# Patient Record
Sex: Male | Born: 1965 | Race: White | Hispanic: No | Marital: Married | Smoking: Current every day smoker
Health system: Southern US, Community
[De-identification: ages and names within clinical notes are randomized; demographics above are authoritative.]

## PROBLEM LIST (undated history)

## (undated) DIAGNOSIS — H409 Unspecified glaucoma: Secondary | ICD-10-CM

## (undated) DIAGNOSIS — E119 Type 2 diabetes mellitus without complications: Secondary | ICD-10-CM

## (undated) DIAGNOSIS — F329 Major depressive disorder, single episode, unspecified: Secondary | ICD-10-CM

## (undated) DIAGNOSIS — M1990 Unspecified osteoarthritis, unspecified site: Secondary | ICD-10-CM

## (undated) DIAGNOSIS — E785 Hyperlipidemia, unspecified: Secondary | ICD-10-CM

## (undated) DIAGNOSIS — F32A Depression, unspecified: Secondary | ICD-10-CM

## (undated) HISTORY — PX: SPINAL FUSION: SHX223

## (undated) HISTORY — DX: Hyperlipidemia, unspecified: E78.5

## (undated) HISTORY — DX: Type 2 diabetes mellitus without complications: E11.9

## (undated) HISTORY — DX: Unspecified glaucoma: H40.9

## (undated) HISTORY — DX: Depression, unspecified: F32.A

## (undated) HISTORY — DX: Unspecified osteoarthritis, unspecified site: M19.90

## (undated) HISTORY — PX: ANKLE SURGERY: SHX546

---
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
## 2001-03-28 ENCOUNTER — Emergency Department (HOSPITAL_COMMUNITY): Admission: EM | Admit: 2001-03-28 | Discharge: 2001-03-28 | Payer: Self-pay | Admitting: *Deleted

## 2005-03-25 ENCOUNTER — Encounter
Admission: RE | Admit: 2005-03-25 | Discharge: 2005-06-23 | Payer: Self-pay | Admitting: Physical Medicine & Rehabilitation

## 2005-03-25 ENCOUNTER — Ambulatory Visit: Payer: Self-pay | Admitting: Physical Medicine & Rehabilitation

## 2005-06-17 ENCOUNTER — Ambulatory Visit: Payer: Self-pay | Admitting: Physical Medicine & Rehabilitation

## 2005-07-08 ENCOUNTER — Ambulatory Visit (HOSPITAL_COMMUNITY)
Admission: RE | Admit: 2005-07-08 | Discharge: 2005-07-08 | Payer: Self-pay | Admitting: Physical Medicine & Rehabilitation

## 2005-07-26 ENCOUNTER — Encounter
Admission: RE | Admit: 2005-07-26 | Discharge: 2005-10-24 | Payer: Self-pay | Admitting: Physical Medicine & Rehabilitation

## 2005-07-26 ENCOUNTER — Ambulatory Visit: Payer: Self-pay | Admitting: Physical Medicine & Rehabilitation

## 2005-09-27 ENCOUNTER — Ambulatory Visit: Payer: Self-pay | Admitting: Physical Medicine & Rehabilitation

## 2006-01-17 ENCOUNTER — Encounter
Admission: RE | Admit: 2006-01-17 | Discharge: 2006-04-17 | Payer: Self-pay | Admitting: Physical Medicine & Rehabilitation

## 2006-02-03 ENCOUNTER — Ambulatory Visit: Payer: Self-pay | Admitting: Physical Medicine & Rehabilitation

## 2006-05-23 ENCOUNTER — Encounter
Admission: RE | Admit: 2006-05-23 | Discharge: 2006-08-21 | Payer: Self-pay | Admitting: Physical Medicine & Rehabilitation

## 2006-07-21 ENCOUNTER — Ambulatory Visit: Payer: Self-pay | Admitting: Physical Medicine & Rehabilitation

## 2006-12-18 ENCOUNTER — Encounter
Admission: RE | Admit: 2006-12-18 | Discharge: 2006-12-19 | Payer: Self-pay | Admitting: Physical Medicine & Rehabilitation

## 2006-12-18 ENCOUNTER — Ambulatory Visit: Payer: Self-pay | Admitting: Physical Medicine & Rehabilitation

## 2007-04-13 ENCOUNTER — Encounter
Admission: RE | Admit: 2007-04-13 | Discharge: 2007-04-15 | Payer: Self-pay | Admitting: Physical Medicine & Rehabilitation

## 2007-04-15 ENCOUNTER — Ambulatory Visit: Payer: Self-pay | Admitting: Physical Medicine & Rehabilitation

## 2007-07-06 ENCOUNTER — Encounter
Admission: RE | Admit: 2007-07-06 | Discharge: 2007-07-08 | Payer: Self-pay | Admitting: Physical Medicine & Rehabilitation

## 2007-07-08 ENCOUNTER — Ambulatory Visit: Payer: Self-pay | Admitting: Physical Medicine & Rehabilitation

## 2007-10-27 ENCOUNTER — Encounter
Admission: RE | Admit: 2007-10-27 | Discharge: 2007-10-28 | Payer: Self-pay | Admitting: Physical Medicine & Rehabilitation

## 2007-10-28 ENCOUNTER — Ambulatory Visit: Payer: Self-pay | Admitting: Physical Medicine & Rehabilitation

## 2008-01-22 ENCOUNTER — Encounter
Admission: RE | Admit: 2008-01-22 | Discharge: 2008-01-22 | Payer: Self-pay | Admitting: Physical Medicine & Rehabilitation

## 2008-01-22 ENCOUNTER — Ambulatory Visit: Payer: Self-pay | Admitting: Physical Medicine & Rehabilitation

## 2008-04-17 ENCOUNTER — Emergency Department (HOSPITAL_COMMUNITY): Admission: EM | Admit: 2008-04-17 | Discharge: 2008-04-17 | Payer: Self-pay | Admitting: Emergency Medicine

## 2008-05-19 ENCOUNTER — Encounter
Admission: RE | Admit: 2008-05-19 | Discharge: 2008-05-23 | Payer: Self-pay | Admitting: Physical Medicine & Rehabilitation

## 2008-05-23 ENCOUNTER — Ambulatory Visit: Payer: Self-pay | Admitting: Physical Medicine & Rehabilitation

## 2008-08-05 ENCOUNTER — Ambulatory Visit: Payer: Self-pay | Admitting: Physical Medicine & Rehabilitation

## 2008-08-05 ENCOUNTER — Encounter
Admission: RE | Admit: 2008-08-05 | Discharge: 2008-11-03 | Payer: Self-pay | Admitting: Physical Medicine & Rehabilitation

## 2008-11-14 ENCOUNTER — Encounter
Admission: RE | Admit: 2008-11-14 | Discharge: 2009-01-04 | Payer: Self-pay | Admitting: Physical Medicine & Rehabilitation

## 2008-11-15 ENCOUNTER — Ambulatory Visit: Payer: Self-pay | Admitting: Physical Medicine & Rehabilitation

## 2009-03-03 ENCOUNTER — Encounter
Admission: RE | Admit: 2009-03-03 | Discharge: 2009-03-03 | Payer: Self-pay | Admitting: Physical Medicine & Rehabilitation

## 2009-06-23 ENCOUNTER — Encounter: Admission: RE | Admit: 2009-06-23 | Discharge: 2009-06-23 | Payer: Self-pay | Admitting: *Deleted

## 2009-07-28 ENCOUNTER — Encounter: Admission: RE | Admit: 2009-07-28 | Discharge: 2009-07-28 | Payer: Self-pay | Admitting: Neurosurgery

## 2009-09-28 ENCOUNTER — Ambulatory Visit (HOSPITAL_COMMUNITY): Admission: RE | Admit: 2009-09-28 | Discharge: 2009-09-29 | Payer: Self-pay | Admitting: Neurosurgery

## 2010-03-22 LAB — CBC
HCT: 48.3 % (ref 39.0–52.0)
Hemoglobin: 16.8 g/dL (ref 13.0–17.0)
MCH: 32.2 pg (ref 26.0–34.0)
MCHC: 34.8 g/dL (ref 30.0–36.0)
MCV: 92.5 fL (ref 78.0–100.0)
Platelets: 208 10*3/uL (ref 150–400)
RBC: 5.22 MIL/uL (ref 4.22–5.81)
RDW: 13.3 % (ref 11.5–15.5)
WBC: 11.1 10*3/uL — ABNORMAL HIGH (ref 4.0–10.5)

## 2010-03-22 LAB — SURGICAL PCR SCREEN
MRSA, PCR: NEGATIVE
Staphylococcus aureus: NEGATIVE

## 2010-05-04 ENCOUNTER — Other Ambulatory Visit: Payer: Self-pay | Admitting: Neurosurgery

## 2010-05-04 DIAGNOSIS — M5410 Radiculopathy, site unspecified: Secondary | ICD-10-CM

## 2010-05-07 ENCOUNTER — Ambulatory Visit
Admission: RE | Admit: 2010-05-07 | Discharge: 2010-05-07 | Disposition: A | Discharge status: Home / Self Care | Payer: BC Managed Care – PPO | Source: Ambulatory Visit | Attending: Neurosurgery | Admitting: Neurosurgery

## 2010-05-07 DIAGNOSIS — M5410 Radiculopathy, site unspecified: Secondary | ICD-10-CM

## 2010-05-22 NOTE — Assessment & Plan Note (Signed)
Clinton West is back regarding his bilateral foot pain.  Pain ranges from 1-  2/10 to 8-9/10 depending on his activity levels.  He is walking more  with his job.  He notes that he has had some weight loss.  He seems to  think that it is because he is eating better.  He says he really does  not exercise much beyond his job required movement.  He is sleeping  fairly well.  He is using Vicodin 10/660 one q.6 h. p.r.n. for  breakthrough pain.   REVIEW OF SYSTEMS:  Notable for the above.  Full 14-point review is in  our written health and history section of the chart.   SOCIAL HISTORY:  The patient is married, living with his wife.  He is  smoking almost a pack of cigarettes per day, but trying to cut back.  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   PHYSICAL EXAMINATION:  VITAL SIGNS:  Blood pressure is 134/84, pulse is  90, respiratory rate 18, he is sating 97% on room air.  GENERAL:  The patient is pleasant, alert and oriented x3.  Weight is  stable.  HEART:  Regular.  CHEST:  Clear.  ABDOMEN:  Soft, nontender.  EXTREMITIES:  The patient walks with minimal antalgia.  NEUROLOGIC:  Intact.   ASSESSMENT:  1. Chronic metatarsalgia.  2. Myofascial cervical pain.   PLAN:  1. I discuss the fact that I would like to see his Vicodin use      decrease and I also feel that use of the medicine for breakthrough      pain has made him complaisant in his weight loss and therapy      efforts.  Therefore, we reduced his hydrocodone to 7.5/500 one q.6      h. p.r.n. #90.  2. The patient needs to work on regular exercise and weight control.  3. I will see him back in about 3 months' time.      Ranelle Oyster, M.D.  Electronically Signed     ZTS/MedQ  D:  08/05/2008 13:17:16  T:  08/06/2008 06:04:50  Job #:  [id number]

## 2010-05-22 NOTE — Assessment & Plan Note (Signed)
Clinton West is back regarding his metatarsalgia.  He did notice a big change  coming off the colchicine or allopurinol.  Pain is about 6/10.  He has  felt a little better with weight loss that he has lost almost 10 pounds  since I saw him last.  He is doing mostly light diet but with occasional  exercise as well.  He is hoping to settle down a bit with his job in the  coming year and is up for a promotion.  Pain is usually in his feet and  associated with prolonged standing and weightbearing.  He describes pain  as sharp, burning, and stabbing.   SOCIAL HISTORY:  As noted above.  He still working about 80 hours a week  doing a lot of traveling.  He is still smoking 1-1/2-pack of cigarettes  per day.   REVIEW OF SYSTEMS:  Notable for the above.  Full review is in the  written health and history section.   PHYSICAL EXAMINATION:  VITAL SIGNS:  Blood pressure is 141/71, pulse is  84, respiratory rate 18.  He is sating 97% on room air.  GENERAL:  The patient is pleasant, alert, and oriented x3.  Weight  perhaps is slightly down.  HEART:  Regular.  CHEST:  Clear.  ABDOMEN:  Soft, nontender.   ASSESSMENT:  1. Chronic metatarsalgia.  2. Myofascial cervical pain.   PLAN:  1. Refill Vicodin 10/650 one q.6 h. p.r.n. #90.  2. Continue with exercise, diet, lifestyle changes, etc.  3. UDS was collected today.  4. We will see him back in about 3 months' time.  I have nothing new      to offer.      Ranelle Oyster, M.D.  Electronically Signed     ZTS/MedQ  D:  01/22/2008 13:23:53  T:  01/23/2008 02:15:36  Job #:  [id number]

## 2010-05-22 NOTE — Assessment & Plan Note (Signed)
Clinton West is back regarding his feet.  He saw Dr. Candis Shine at [facility],  who really had no new recommendations other than some orthotics.  He has  gotten some better shoes and is doing a bit better there.  He has come  to some realization that he has to change his lifestyle in order to help  his pain.  He uses Vicodin for breakthrough pain still with some relief.  His sleep is fair.  He is looking at changing to a job which is more  sedentary and less stressful in its travel requirements.  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   REVIEW OF SYSTEMS:  Notable for the above.  Other pertinent positives  are in the health and history section of the chart.   SOCIAL HISTORY:  The patient is married.  He is still smoking 1 pack per  day and is trying to cut that down.   PHYSICAL EXAMINATION:  VITAL SIGNS:  Blood pressure is 128/68, pulse 82,  respiratory rate 18, satting 97% on room air.  GENERAL:  The patient is pleasant, alert and oriented x 3.  His affect  is a bit brighter and he seemed a bit more energized than I had seen him  today.  He continues to have flattened arches and shows some  hyperpronation of the feet as he bears weight.  The weight appears  generally the same.  HEART:  Regular.  CHEST:  Clear.   ASSESSMENT:  1. Chronic metatarsalgia and foot pain.  2. Myofascial neck pain.   PLAN:  1. Continue hydrocodone 10/650 for breakthrough pain.  2. Continue appropriate shoewear and orthotics.  3. Encouraged weight loss and more appropriate diet and lifestyle.  4. I will see him back in about 4 months' time.      Ranelle Oyster, M.D.  Electronically Signed     ZTS/MedQ  D:  12/19/2006 13:46:49  T:  12/20/2006 11:10:15  Job #:  [id number]

## 2010-05-22 NOTE — Assessment & Plan Note (Signed)
Mr. Ronan is back regarding his chronic foot pain.  Essentially, he  has had some increase in his pain, as he has been on his feet more.  He  changed jobs and is more local but the job requires more physical  activity than previous.  He rates his pain at 4-5/10.  His Oswestry  score is 14% today.  Pain is sharp, burning, stabbing, aching.  Pain  interferes with general activity, relations with others, enjoyment of  life on a mild level.  Sleep has been poor and mostly related to the  intensity and stress over his job.   Review of systems is unchanged.  He is still working on weight loss and  does not get enough exercise as a whole.   SOCIAL HISTORY:  Noted above.  The patient is married and still smoking  a pack and a half cigarettes per day.   PHYSICAL EXAMINATION:  Blood pressure is 136/83, pulse is 76,  respiratory rate 18.  He is sating 98% on room air.  The patient is  pleasant, alert, and oriented x3.  Weight is unchanged.  Heart is  regular.  Chest is clear.  Abdomen is soft, nontender.  Feet continued  to be tender along the metatarsals.   ASSESSMENT:  1. Chronic metatarsalgia.  2. Myofascial cervical pain.   PLAN:  1. Refill Vicodin 10/650 one q.6 h. p.r.n.  2. The patient has ongoing psychosocial issues which are prominent.      Until he is able to handle these a bit more productively and set      out time for himself to work on his health hygiene and exercise,      etc, I think he will continue the struggle with pain-related      issues.  3. Discussed use of naproxen over-the-counter 2-3 b.i.d. during      flares.  Otherwise, he should use 1 twice a day GI permitting.  4. I will see him back in about 3 months' time.      Ranelle Oyster, M.D.  Electronically Signed     ZTS/MedQ  D:  05/23/2008 09:53:15  T:  05/24/2008 00:21:26  Job #:  [id number]

## 2010-05-22 NOTE — Assessment & Plan Note (Signed)
Clinton West is back regarding his metatarsalgia.  We continued the colchicine  at last visit and added allopurinol to see if this will further improve  his symptoms.  He has not noticed much change.  The pain is still 6-  7/10.  He described it as sharp, burning, stabbing, and aching.  He  continues to go through a lot of stress in his life regarding his  business, psychosocial issues, etc.  He has had minimal exercise.  He is  talking about leaving his company and going on his own for business and  to improve his overall quality of life.  He describes pain is sharp,  burning, stabbing, and aching.   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  He is married.  His marriage is under a bit of stress.  He is  still smoking one and a half packs of cigarettes per day.  He  occasionally has alcohol.   REVIEW OF SYSTEMS:  Notable for the above.  Full review is in the  written health and history section of the chart.   PHYSICAL EXAMINATION:  VITAL SIGNS:  Blood pressure 135/82, pulse 94,  and respiratory rate 18, and he is sating 96% on room air.  GENERAL:  The patient is pleasant, alert, and oriented x3.  Weight is  unchanged.  He remains overweight.  He is bright and alert.  HEART:  Regular.  CHEST:  Clear.  ABDOMEN:  Soft and nontender.  EXTREMITIES:  No gross changes are noted in the feet today.  He has  flattened arches, and his feet are hyperpronated.   ASSESSMENT:  1. Chronic metatarsalgia with bilateral foot pain.  2. Myofascial neck pain.   PLAN:  1. We will try holding allopurinol and colchicine to see how his      symptoms are.  If his symptoms should increase, we need to check a      uric acid level and titrate allopurinol to his needed level.  2. Refilled the Vicodin 10/650 for breakthrough pain.  3. Continued encouraging better lifestyle, diet, exercise, etc.  4. I will see him back in 3 months.      Ranelle Oyster, M.D.  Electronically Signed     ZTS/MedQ  D:  10/28/2007 13:04:48  T:  10/29/2007 27:06:23  Job #:  [id number]

## 2010-05-22 NOTE — Assessment & Plan Note (Signed)
Clinton West is back regarding his metatarsalgia.  Not a lot of change since I  last saw him.  Feet bother him the more he is on them and busy.  He uses  hydrocodone for breakthrough symptoms.  He lost his shoes and custom  insoles, a few days ago.  He rates his pain an 8-9/10.  Sleep is fair.  Psychosocial issues still are outstanding, and he is working on some  other business ventures.   REVIEW OF SYSTEMS:  Notable for the above.  Full review is in written  health and history section in chart.  I did talk to him today and ask if  he had ever suffered from gout before, and he revealed to me that he  had.  He had not disclosed this to me previously.  He had taken  colchicine in the past for flare ups.   SOCIAL HISTORY:  The patient is married, and other pertinent positives  are above.  He is working on potentially two new business ventures.   PHYSICAL EXAMINATION:  VITAL SIGNS:  Blood pressure is 138/67.  Pulse is  94, respiratory rate 18.  He is satting 96% on room air.  GENERAL:  The patient is pleasant, alert and oriented x3.  Weight is  generally stable.  Affect is bright and appropriate.  Arches appear  flattened with hypo-pronation still noted.  HEART:  Regular.  CHEST:  Clear.  ABDOMEN:  Soft, nontender.   ASSESSMENT:  1. Chronic metatarsalgia with foot pain bilaterally.  2. Myofascial neck pain.   PLAN:  1. Will try colchicine scheduled 0.6 mg q. day. to treat this      potentially as chronic gout.  I wonder if that is what has been      going on all this time, although imaging was not indicative of      this.  2. Continue with appropriate shoe wear and orthotics.  I will have a      new set made per [facility].  3. Continue the improvement of diet and exercise, as his lifestyle and      job allow.  4. I will see him back in 3 months' time.      Ranelle Oyster, M.D.  Electronically Signed     ZTS/MedQ  D:  04/15/2007 12:46:03  T:  04/15/2007 13:04:20  Job  #:  [id number]

## 2010-05-22 NOTE — Assessment & Plan Note (Signed)
FOLLOWUP OFFICE NOTE   Clinton West is back regarding his pain.  He had some new pain in the neck over  the last day or so, developed significant pain into the suboccipital  region.  It may have been from how he slept on a plane with his head  flexed forward.  Foot pain is similar.  He has stopped Lyrica as he felt  it was no longer beneficial.  He is using the Pamelor and Vicodin.  The  Vicodin is for his primary source of pain.  He continues to work  extraordinary numbers of hours and essentially traveling throughout the  month.  He was home 4 days last month.  He rates his pain a 5/10 to  6/10.  His feet are worse with walking and standing.  He does try to  exercise, but has a hard time watching what he eats.  The patient flew  back here from [place] today to make this appointment.   REVIEW OF SYSTEMS:  Notable for the above.  He is physically and  mentally exhausted from his job.  The patient remains married.   PHYSICAL EXAM:  Blood pressure 157/78, pulse 92, respiratory rate is 18,  he is satting 98% on room air.  The patient appears very fatigued and worn out.  His weight appears  higher than when I saw him last.  Mood is fair.  Gait remains antalgic.  Slightly wide-based still.  He has flat arches and pain over the  metatarsal-tarsal joints.  Continues to hyperpronate feet when he walks.  Neck was very tender along the upper trapezius muscle and cervical  paraspinals, which reproduced pain with palpation today.   ASSESSMENT:  1. Chronic metatarsalgia and foot pain.  2. Myofascial neck pain.   PLAN:  1. After informed consent we injected 2 trigger points in both the      suboccipital areas using 2 cc of 1% lidocaine.  The patient      tolerated it well.  2. We gave him a trial of Limbrel 500 mg b.i.d. for breakthrough      symptoms.  3. Continue hydrocodone 10/60 one q.6h p.r.n. breakthrough pain #90.  4. Will make a referral to Dr. Candis Shine at [facility]  trauma/orthopedic surgeon who had dealt with his feet previously to      see if he has any further recommendations.  I think, most      obviously, Clinton West needs to work on alternative job arrangements,      which would include more time off at home, [employer].  What he is      doing now, he obviously will not be able to continue much longer      from both a physical and mental standpoint.  He needs to focus on      diet and exercise as well, which are not allowed with the hours he      is working.  5. I will see the patient back in about 4 months' time.      Ranelle Oyster, M.D.  Electronically Signed     ZTS/MedQ  D:  07/22/2006 12:24:57  T:  07/22/2006 20:52:21  Job #:  [id number]   cc:   Clinton West, M.D.  Fax: [phone]

## 2010-05-22 NOTE — Assessment & Plan Note (Signed)
HISTORY:  Clinton West is back regarding his metatarsalgia.  He feels that the  colchicine helped a bit for his foot pain.  He seems to be having a bit  more tolerance when he is up on his feet.  We never had a uric acid  checked.  He uses hydrocodone for more severe symptoms.  He has changes  to MET diet over the last several weeks, trying to eat better and drink  well water.  He feels that this is made a difference as well.  He  continues to have a lot of psychosocial/business stresses up on him.  He  rates his pain as 7-8/10.  He described as sharp, burning, aching.  Pain  interferes with general activity, relations with others, and enjoyment  of life on a mild level.   SOCIAL HISTORY:  Noted above.  He is still smoking 3 quarters of pack of  cigarettes per day.   PHYSICAL EXAMINATION:  VITAL SIGNS:  Blood pressure is 140/68, pulse is  94, respiratory rate 18, and he is sating 97% on room air.  GENERAL:  The patient is pleasant, alert, and oriented x3.  Weight is  unchanged.  Affect is bright and he is very alert.  HEART:  Regular.  CHEST:  Clear.  ABDOMEN:  Soft and nontender.  EXTREMITIES:  Feet are not grossly tender to touch.  They are flattened  and hypopronated.   ASSESSMENT:  1. Chronic metatarsalgia with foot pain bilaterally.  2. Myofascial neck pain.   PLAN:  1. We will add allopurinol 100 mg q.a.m.  2. Colchicine 0.6 mg daily.  3. Vicodin 10/660 for breakthrough pain control.  4. Encouraged to improve diet and exercise as he is attempting.  5. Continue with appropriate shoe wear and orthotics.  6. I will see him back in 3 months.      Ranelle Oyster, M.D.  Electronically Signed     ZTS/MedQ  D:  07/08/2007 13:29:15  T:  07/09/2007 06:48:51  Job #:  [id number]

## 2010-09-03 ENCOUNTER — Encounter (HOSPITAL_COMMUNITY)
Admission: RE | Admit: 2010-09-03 | Discharge: 2010-09-03 | Disposition: A | Discharge status: Home / Self Care | Payer: BC Managed Care – PPO | Source: Ambulatory Visit | Attending: Neurosurgery | Admitting: Neurosurgery

## 2010-09-03 LAB — CBC
HCT: 47.3 % (ref 39.0–52.0)
MCV: 91 fL (ref 78.0–100.0)
RBC: 5.2 MIL/uL (ref 4.22–5.81)
RDW: 13.2 % (ref 11.5–15.5)
WBC: 10.3 10*3/uL (ref 4.0–10.5)

## 2010-09-13 ENCOUNTER — Observation Stay (HOSPITAL_COMMUNITY): Payer: BC Managed Care – PPO

## 2010-09-13 ENCOUNTER — Observation Stay (HOSPITAL_COMMUNITY)
Admission: RE | Admit: 2010-09-13 | Discharge: 2010-09-14 | Disposition: A | Discharge status: Home / Self Care | Payer: BC Managed Care – PPO | Source: Ambulatory Visit | Attending: Neurosurgery | Admitting: Neurosurgery

## 2010-09-13 DIAGNOSIS — F17200 Nicotine dependence, unspecified, uncomplicated: Secondary | ICD-10-CM | POA: Insufficient documentation

## 2010-09-13 DIAGNOSIS — E669 Obesity, unspecified: Secondary | ICD-10-CM | POA: Insufficient documentation

## 2010-09-13 DIAGNOSIS — G4733 Obstructive sleep apnea (adult) (pediatric): Secondary | ICD-10-CM | POA: Insufficient documentation

## 2010-09-13 DIAGNOSIS — Z981 Arthrodesis status: Secondary | ICD-10-CM | POA: Insufficient documentation

## 2010-09-13 DIAGNOSIS — M47812 Spondylosis without myelopathy or radiculopathy, cervical region: Principal | ICD-10-CM | POA: Insufficient documentation

## 2010-09-13 DIAGNOSIS — Z01812 Encounter for preprocedural laboratory examination: Secondary | ICD-10-CM | POA: Insufficient documentation

## 2010-10-04 ENCOUNTER — Other Ambulatory Visit: Payer: Self-pay | Admitting: Neurosurgery

## 2010-10-04 ENCOUNTER — Ambulatory Visit
Admission: RE | Admit: 2010-10-04 | Discharge: 2010-10-04 | Disposition: A | Discharge status: Home / Self Care | Payer: BC Managed Care – PPO | Source: Ambulatory Visit | Attending: Neurosurgery | Admitting: Neurosurgery

## 2010-10-04 DIAGNOSIS — M542 Cervicalgia: Secondary | ICD-10-CM

## 2010-10-08 NOTE — Op Note (Signed)
NAME:  Clinton West, Clinton West NO.:  [id number]  MEDICAL RECORD NO.:  [id number]  LOCATION:  3599                         FACILITY:  [facility]  PHYSICIAN:  Reinaldo Meeker, M.D. DATE OF BIRTH:  1965/07/14  DATE OF PROCEDURE:  09/13/2010 DATE OF DISCHARGE:                              OPERATIVE REPORT   PREOPERATIVE DIAGNOSIS:  Spondylosis, C6-C7 status post anterior cervical diskectomy and fusion plate in Z6-X0, C6-C7.  POSTOPERATIVE DIAGNOSIS:  Spondylosis, C6-C7 status post anterior cervical diskectomy and fusion plate in R6-E4, C6-C7.  PROCEDURE:  Removal of anterior cervical plating C5-C7 with exploration of C5-C6 fusion followed by redo anterior cervical diskectomy at C6-C7 with trabecular metal interbody fusion followed by Tether anterior cervical plating at C6-C7.  SURGEON:  Reinaldo Meeker, MD  ASSISTANT:  Tia Alert, MD  PROCEDURE IN DETAIL:  After being placed in the supine position and 10 pounds halter traction, the patient's neck was prepped and draped in the usual sterile fashion.  Localizing fluoroscopy was used prior to incision to identify the appropriate level.  Transverse incision was made in the right anterior neck, started at the midline, headed towards the medial aspect of the sternocleidomastoid muscle.  The platysma muscle had been incised transversely.  The natural fascial plane between the strap muscles medially and sternocleidomastoid laterally was identified and followed down to the anterior aspect of the cervical spine.  Longus colli muscles were identified, split in the midline, and stripped away bilaterally with Optician, dispensing. Previous anterior cervical plate was then dissected free of soft tissue and removed.  C5-C6 fusion was identified and found to be solid.  We then dissected soft tissue away and placed a self-retaining retractor for exposure of C6-C7.  We used fluoroscopy to identify what the previous bone  graft was and used a high-speed drill to drill down through it.  We saved the bony shavings for use later in the case.  We continued to go progressively deeper and widened the disk space.  We then brought the microscope in to the later part of the case.  We got deep within the bone.  We did begin at the anterior soft tissue in the epidural space.  It was debrided through our bone graft.  We then dissected this free and gave it excellent decompression of the spinal dura including removal of the large bone spur on the left side and a foraminotomy and foraminal decompression at the C7 nerve on the left. We did decompression towards the right side as well.  We were not as aggressive due to the scarring and the fact that the patient was asymptomatic on that side.  At this time, inspection was carried out in all directions for evidence of residual compression, none could be identified.  Large amounts of irrigation was carried out.  Any bleeding controlled with bipolar coagulation and Gelfoam.  An 8-mm trabecular metal graft was chosen, filled the mixture of EquivaBone, autologous bone, and then impacted into the disk space with hemostasis confirmed once more.  We found it to be in excellent position.  An appropriate length Tether anterior cervical plate was then chosen.  Under fluoroscopic guidance, drill holes were placed  followed by placing of 3 standard 15-mm screws.  We did place one rescue screw in one of the previous holes but none of the other holes were used.  Final fluoroscopy showed good placement of plate, screws, and interbody spacer.  Large amounts of irrigation was carried out and any bleeding controlled with bipolar coagulation.  The wound was then closed with inverted Vicryl on the platysma muscle, inverted 5-0 PDS in subcuticular subcutaneous layer, and Steri-Strips on the skin.  Sterile dressing and soft collar applied.  The patient was extubated and taken to the recovery room  in stable condition.          ______________________________ Reinaldo Meeker, M.D.     ROK/MEDQ  D:  09/13/2010  T:  09/13/2010  Job:  [id number]  Electronically Signed by Aliene Beams M.D. on 10/08/2010 08:34:18 PM

## 2011-07-08 ENCOUNTER — Ambulatory Visit: Payer: BC Managed Care – PPO | Attending: Family Medicine

## 2011-07-08 DIAGNOSIS — IMO0001 Reserved for inherently not codable concepts without codable children: Secondary | ICD-10-CM | POA: Insufficient documentation

## 2011-07-08 DIAGNOSIS — M25619 Stiffness of unspecified shoulder, not elsewhere classified: Secondary | ICD-10-CM | POA: Insufficient documentation

## 2011-07-08 DIAGNOSIS — M542 Cervicalgia: Secondary | ICD-10-CM | POA: Insufficient documentation

## 2012-02-03 ENCOUNTER — Other Ambulatory Visit: Payer: Self-pay | Admitting: Neurosurgery

## 2012-02-03 DIAGNOSIS — M47817 Spondylosis without myelopathy or radiculopathy, lumbosacral region: Secondary | ICD-10-CM

## 2013-11-03 ENCOUNTER — Other Ambulatory Visit: Payer: Self-pay | Admitting: Family Medicine

## 2013-11-03 ENCOUNTER — Ambulatory Visit
Admission: RE | Admit: 2013-11-03 | Discharge: 2013-11-03 | Disposition: A | Discharge status: Home / Self Care | Payer: BC Managed Care – PPO | Source: Ambulatory Visit | Attending: Family Medicine | Admitting: Family Medicine

## 2013-11-03 DIAGNOSIS — R0602 Shortness of breath: Secondary | ICD-10-CM

## 2014-03-02 ENCOUNTER — Other Ambulatory Visit (HOSPITAL_COMMUNITY): Payer: Self-pay | Admitting: Family Medicine

## 2014-03-02 DIAGNOSIS — E236 Other disorders of pituitary gland: Secondary | ICD-10-CM

## 2014-03-14 ENCOUNTER — Encounter: Payer: Self-pay | Admitting: *Deleted

## 2014-03-14 ENCOUNTER — Encounter: Payer: BLUE CROSS/BLUE SHIELD | Attending: Family Medicine | Admitting: *Deleted

## 2014-03-14 DIAGNOSIS — E119 Type 2 diabetes mellitus without complications: Secondary | ICD-10-CM | POA: Diagnosis not present

## 2014-03-14 DIAGNOSIS — Z713 Dietary counseling and surveillance: Secondary | ICD-10-CM | POA: Diagnosis not present

## 2014-03-14 NOTE — Patient Instructions (Signed)
Goals:  Follow Diabetes Meal Plan as instructed  Eat 3 meals and 2 snacks, every 3-5 hrs  Limit carbohydrate intake to 45-60 grams carbohydrate/meal  Limit carbohydrate intake to 30 grams carbohydrate/snack  Add lean protein foods to meals/snacks  Monitor glucose levels as instructed by your doctor  Aim for 30 mins of physical activity daily  Bring food record and glucose log to your next nutrition visit

## 2014-03-14 NOTE — Progress Notes (Signed)
Diabetes Self-Management Education  Visit Type:    Appt. Start Time: 0945 Appt. End Time: 1115  03/14/2014  Mr. Clinton West, identified by name and date of birth, is a 49 y.o. male with a diagnosis of Diabetes: Type 2.  Other people present during visit:  Spouse/SO   ASSESSMENT  Clinton West was recently diagnosed with type 2 diabetes.  There is a strong family history of diabetes, but he feels his lifestyle choices contributed to his diabetes diagnosis.  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  He frequently skips meals and is not physicaly active.    Initial Visit Information:  Are you currently following a meal plan?: Yes (low-carb, no white foods)   Are you taking your medications as prescribed?: Yes (sometimes forget) Are you checking your feet?: Yes How many days per week are you checking your feet?: 7 How often do you need to have someone help you when you read instructions, pamphlets, or other written materials from your doctor or pharmacy?: 1 - Never    Psychosocial:     Patient Belief/Attitude about Diabetes: Other (comment) (frustrated/overwhelmed) Self-care barriers: None Self-management support: Family Other persons present: Spouse/SO Patient Concerns: Nutrition/Meal planning, Monitoring Special Needs: None Preferred Learning Style: No preference indicated Learning Readiness: Contemplating  Complications:   Last HgB A1C per patient/outside source: 6.8 mg/dL How often do you check your blood sugar?: 0 times/day (not testing) (has been told to test, but doesn't know how ) Number of hypoglycemic episodes per month: 0 Number of hyperglycemic episodes per week:  (patient not sure.  feels like he already needs to go to the bathroom a lot and his visiion is already blurry) Have you had a dilated eye exam in the past 12 months?: Yes Have you had a dental exam in the past 12 months?: Yes  Diet Intake:  Breakfast:  3-4 times low sugar protein shake; might not have anything (gets bored with food and doesn't want to eat) Lunch: salad, meat- tries to limit the starches.  sometimes skips or might have hot dog; maybe granola bar Dinner: might go out- meat, vegetable; could be [place] or chicken wings or steak Snack (evening): sugar free candy (2 pieces); sugar-free apple pie, NSA ice cream or pudding Beverage(s): coffee with cream and Truvia, 1/2 and 1/2 tea or diet peach tea, G2, raspberry lemonade, diet soda, beer  Exercise:  Exercise: ADL's (walks sometimes at work, but also sits a lot)  Individualized Plan for Diabetes Self-Management Training:   Learning Objective:  Patient will have a greater understanding of diabetes self-management.  Patient education plan per assessed needs and concerns is to attend individual sessions.     Education Topics Reviewed with Patient Today:  Definition of diabetes, type 1 and 2, and the diagnosis of diabetes, Factors that contribute to the development of diabetes Role of diet in the treatment of diabetes and the relationship between the three main macronutrients and blood glucose level, Food label reading, portion sizes and measuring food., Carbohydrate counting, Effects of alcohol on blood glucose and safety factors with consumption of alcohol., Information on hints to eating out and maintain blood glucose control. Role of exercise on diabetes management, blood pressure control and cardiac health. Reviewed patients medication for diabetes, action, purpose, timing of dose and side effects. Purpose and frequency of SMBG. Taught treatment of hypoglycemia - the 15 rule. Assessed and discussed foot care and prevention of  foot problems Role of stress on diabetes, Identified and addressed patients feelings and concerns about diabetes      PATIENTS GOALS/Plan (Developed by the patient):     Plan:   Patient Instructions  Goals:  Follow Diabetes Meal Plan as  instructed  Eat 3 meals and 2 snacks, every 3-5 hrs  Limit carbohydrate intake to 45-60 grams carbohydrate/meal  Limit carbohydrate intake to 30 grams carbohydrate/snack  Add lean protein foods to meals/snacks  Monitor glucose levels as instructed by your doctor  Aim for 30 mins of physical activity daily  Bring food record and glucose log to your next nutrition visit     Expected Outcomes:  Other (comment) (patient frustrated at this time.  expect minimal changes)  Education material provided: Living Well with Diabetes, Meal plan card and Snack sheet  If problems or questions, patient to contact team via:  Phone  Future DSME appointment: PRN

## 2014-03-14 NOTE — Addendum Note (Signed)
Addended byCarrolyn Leigh: REAVIS, Arthea Nobel W on: 03/14/2014 02:35 PM   Modules accepted: Medications

## 2014-03-15 ENCOUNTER — Ambulatory Visit (HOSPITAL_COMMUNITY): Admission: RE | Admit: 2014-03-15 | Payer: BLUE CROSS/BLUE SHIELD | Source: Ambulatory Visit

## 2014-08-10 ENCOUNTER — Other Ambulatory Visit: Payer: Self-pay | Admitting: Neurosurgery

## 2014-08-10 DIAGNOSIS — M542 Cervicalgia: Secondary | ICD-10-CM

## 2014-08-22 ENCOUNTER — Other Ambulatory Visit: Payer: BLUE CROSS/BLUE SHIELD

## 2014-09-14 ENCOUNTER — Ambulatory Visit
Admission: RE | Admit: 2014-09-14 | Discharge: 2014-09-14 | Disposition: A | Discharge status: Home / Self Care | Payer: BLUE CROSS/BLUE SHIELD | Source: Ambulatory Visit | Attending: Neurosurgery | Admitting: Neurosurgery

## 2014-09-14 DIAGNOSIS — M542 Cervicalgia: Secondary | ICD-10-CM

## 2014-09-14 MED ORDER — MEPERIDINE HCL 100 MG/ML IJ SOLN
100.0000 mg | Freq: Once | INTRAMUSCULAR | Status: AC
  Administered 2014-09-14: 100 mg via INTRAMUSCULAR

## 2014-09-14 MED ORDER — IOHEXOL 300 MG/ML  SOLN
10.0000 mL | Freq: Once | INTRAMUSCULAR | Status: DC | PRN
  Administered 2014-09-14: 10 mL via INTRATHECAL

## 2014-09-14 MED ORDER — ONDANSETRON HCL 4 MG/2ML IJ SOLN
4.0000 mg | Freq: Once | INTRAMUSCULAR | Status: AC
  Administered 2014-09-14: 4 mg via INTRAMUSCULAR

## 2014-09-14 MED ORDER — DIAZEPAM 5 MG PO TABS
10.0000 mg | ORAL_TABLET | Freq: Once | ORAL | Status: AC
  Administered 2014-09-14: 10 mg via ORAL

## 2014-09-14 NOTE — Discharge Instructions (Signed)
Myelogram Discharge Instructions  1. Go home and rest quietly for the next 24 hours.  It is important to lie flat for the next 24 hours.  Get up only to go to the restroom.  You may lie in the bed or on a couch on your back, your stomach, your left side or your right side.  You may have one pillow under your head.  You may have pillows between your knees while you are on your side or under your knees while you are on your back.  2. DO NOT drive today.  Recline the seat as far back as it will go, while still wearing your seat belt, on the way home.  3. You may get up to go to the bathroom as needed.  You may sit up for 10 minutes to eat.  You may resume your normal diet and medications unless otherwise indicated.  Drink plenty of extra fluids today and tomorrow.  4. The incidence of a spinal headache with nausea and/or vomiting is about 5% (one in 20 patients).  If you develop a headache, lie flat and drink plenty of fluids until the headache goes away.  Caffeinated beverages may be helpful.  If you develop severe nausea and vomiting or a headache that does not go away with flat bed rest, call [phone].  5. You may resume normal activities after your 24 hours of bed rest is over; however, do not exert yourself strongly or do any heavy lifting tomorrow.  6. Call your physician for a follow-up appointment.   You may resume Adderall on Thursday, September 15, 2014 after 9:30a.m.

## 2014-09-14 NOTE — Progress Notes (Signed)
Patient states he has been off Adderall for at least the past two days.  jkl

## 2014-09-20 ENCOUNTER — Encounter (HOSPITAL_COMMUNITY): Payer: Self-pay | Admitting: *Deleted

## 2014-09-20 ENCOUNTER — Emergency Department (HOSPITAL_COMMUNITY): Payer: BLUE CROSS/BLUE SHIELD

## 2014-09-20 ENCOUNTER — Emergency Department (HOSPITAL_COMMUNITY)
Admission: EM | Admit: 2014-09-20 | Discharge: 2014-09-21 | Disposition: A | Discharge status: Home / Self Care | Payer: BLUE CROSS/BLUE SHIELD | Attending: Emergency Medicine | Admitting: Emergency Medicine

## 2014-09-20 DIAGNOSIS — Z7982 Long term (current) use of aspirin: Secondary | ICD-10-CM | POA: Insufficient documentation

## 2014-09-20 DIAGNOSIS — E119 Type 2 diabetes mellitus without complications: Secondary | ICD-10-CM | POA: Diagnosis not present

## 2014-09-20 DIAGNOSIS — Z79899 Other long term (current) drug therapy: Secondary | ICD-10-CM | POA: Insufficient documentation

## 2014-09-20 DIAGNOSIS — K859 Acute pancreatitis, unspecified: Secondary | ICD-10-CM | POA: Diagnosis not present

## 2014-09-20 DIAGNOSIS — E669 Obesity, unspecified: Secondary | ICD-10-CM | POA: Diagnosis not present

## 2014-09-20 DIAGNOSIS — Z720 Tobacco use: Secondary | ICD-10-CM | POA: Diagnosis not present

## 2014-09-20 DIAGNOSIS — R1013 Epigastric pain: Secondary | ICD-10-CM | POA: Diagnosis present

## 2014-09-20 LAB — LIPASE, BLOOD: Lipase: 990 U/L — ABNORMAL HIGH (ref 22–51)

## 2014-09-20 LAB — CBC
HEMATOCRIT: 46.6 % (ref 39.0–52.0)
Hemoglobin: 15.7 g/dL (ref 13.0–17.0)
MCH: 31.5 pg (ref 26.0–34.0)
MCHC: 33.7 g/dL (ref 30.0–36.0)
MCV: 93.6 fL (ref 78.0–100.0)
PLATELETS: 197 10*3/uL (ref 150–400)
RBC: 4.98 MIL/uL (ref 4.22–5.81)
RDW: 13.3 % (ref 11.5–15.5)
WBC: 13.7 10*3/uL — ABNORMAL HIGH (ref 4.0–10.5)

## 2014-09-20 LAB — COMPREHENSIVE METABOLIC PANEL
ALBUMIN: 3.8 g/dL (ref 3.5–5.0)
ALT: 24 U/L (ref 17–63)
AST: 20 U/L (ref 15–41)
Alkaline Phosphatase: 58 U/L (ref 38–126)
Anion gap: 8 (ref 5–15)
BILIRUBIN TOTAL: 0.5 mg/dL (ref 0.3–1.2)
BUN: 14 mg/dL (ref 6–20)
CHLORIDE: 102 mmol/L (ref 101–111)
CO2: 26 mmol/L (ref 22–32)
CREATININE: 0.86 mg/dL (ref 0.61–1.24)
Calcium: 9.1 mg/dL (ref 8.9–10.3)
GFR calc Af Amer: >60 mL/min (ref >60)
GLUCOSE: 112 mg/dL — AB (ref 65–99)
POTASSIUM: 3.9 mmol/L (ref 3.5–5.1)
Sodium: 136 mmol/L (ref 135–145)
TOTAL PROTEIN: 7.6 g/dL (ref 6.5–8.1)

## 2014-09-20 LAB — URINE MICROSCOPIC-ADD ON

## 2014-09-20 LAB — URINALYSIS, ROUTINE W REFLEX MICROSCOPIC
Glucose, UA: NEGATIVE mg/dL
KETONES UR: 15 mg/dL — AB
Leukocytes, UA: NEGATIVE
Nitrite: NEGATIVE
PH: 5 (ref 5.0–8.0)
Protein, ur: NEGATIVE mg/dL
SPECIFIC GRAVITY, URINE: 1.027 (ref 1.005–1.030)
Urobilinogen, UA: 0.2 mg/dL (ref 0.0–1.0)

## 2014-09-20 LAB — TROPONIN I: Troponin I: <0.03 ng/mL (ref <0.031)

## 2014-09-20 MED ORDER — GI COCKTAIL ~~LOC~~
30.0000 mL | Freq: Once | ORAL | Status: AC
  Administered 2014-09-20: 30 mL via ORAL

## 2014-09-20 MED ORDER — IOHEXOL 300 MG/ML  SOLN
100.0000 mL | Freq: Once | INTRAMUSCULAR | Status: AC | PRN
  Administered 2014-09-20: 100 mL via INTRAVENOUS

## 2014-09-20 MED ORDER — ONDANSETRON HCL 4 MG/2ML IJ SOLN
4.0000 mg | Freq: Once | INTRAMUSCULAR | Status: AC
  Administered 2014-09-20: 4 mg via INTRAVENOUS
  Filled 2014-09-20: qty 2

## 2014-09-20 MED ORDER — SODIUM CHLORIDE 0.9 % IV SOLN: 1000.0000 mL | INTRAVENOUS | Status: DC

## 2014-09-20 MED ORDER — SODIUM CHLORIDE 0.9 % IV SOLN
1000.0000 mL | Freq: Once | INTRAVENOUS | Status: AC
  Administered 2014-09-20: 1000 mL via INTRAVENOUS

## 2014-09-20 MED ORDER — HYDROMORPHONE HCL 1 MG/ML IJ SOLN
1.0000 mg | INTRAMUSCULAR | Status: DC | PRN
  Administered 2014-09-20 (×2): 1 mg via INTRAVENOUS
  Filled 2014-09-20 (×2): qty 1

## 2014-09-20 MED ORDER — OXYCODONE-ACETAMINOPHEN 5-325 MG PO TABS: 1.0000 | ORAL_TABLET | Freq: Four times a day (QID) | ORAL | Status: DC | PRN

## 2014-09-20 MED ORDER — ONDANSETRON 8 MG PO TBDP
8.0000 mg | ORAL_TABLET | Freq: Three times a day (TID) | ORAL | Status: DC | PRN
Start: 2014-09-20 — End: 2019-06-01

## 2014-09-20 NOTE — Discharge Instructions (Signed)

## 2014-09-20 NOTE — ED Notes (Signed)
Patient presents with c/o left lower abd pain for about 1 week.  Noticed the pain traveling up to his epigastric area.  Was seen by MD for the abd pain and was told to take an ASA  and was told it "may have been a small clot that passed from his leg"  Has been taking Mylanta, Zantac  Denies N/V, no SOB

## 2014-09-20 NOTE — ED Provider Notes (Signed)
CSN: [id number]     Arrival date & time 09/20/14  1848 History   First MD Initiated Contact with Patient 09/20/14 2120     Chief Complaint  Patient presents with  . Abdominal Pain   Patient is a 49 y.o. male presenting with abdominal pain.  Abdominal Pain  patient states the symptoms started about a week ago. Initially the pain was in the left lower quadrant. Gradually over the last few days pain has moved up towards the epigastric area. Patient states the pain in the upper abdomen radiates into his chest. It's sharp but also feels like a pressure. Tried to take Mylanta and Zantac but has not had any relief. Eyes any trouble with nausea or vomiting. Palpation increases the pain but he has not noticed anything in particular with his diet. No nausea vomiting or diarrhea. No blood in his stool. No shortness of breath. Patient drinks alcohol on occasion. Maybe 1 beer a couple times a week.  Past Medical History  Diagnosis Date  . Diabetes mellitus without complication    Past Surgical History  Procedure Laterality Date  . Spinal fusion  2012, 2013   Family History  Problem Relation Age of Onset  . Diabetes Brother   . Diabetes Maternal Uncle   . Diabetes Maternal Grandmother   . Cancer Maternal Grandmother    Social History  Substance Use Topics  . Smoking status: Current Every Day Smoker  . Smokeless tobacco: Never Used  . Alcohol Use: Yes     Comment: socially    Review of Systems  Gastrointestinal: Positive for abdominal pain.  All other systems reviewed and are negative.     Allergies  Other  Home Medications   Prior to Admission medications   Medication Sig Start Date End Date Taking? Authorizing Provider  amphetamine-dextroamphetamine (ADDERALL XR) 20 MG 24 hr capsule Take 20 mg by mouth 2 (two) times daily as needed (for ADD).  08/26/14  Yes Historical Provider, MD  aspirin 325 MG EC tablet Take 325 mg by mouth daily.   Yes Historical Provider, MD  Cholecalciferol  (VITAMIN D3) 10000 UNITS capsule Take 10,000 Units by mouth daily.   Yes Historical Provider, MD  Coenzyme Q10-Red Yeast Rice (CO Q-10 PLUS RED YEAST RICE PO) Take by mouth.   Yes Historical Provider, MD  diclofenac sodium (VOLTAREN) 1 % GEL Apply 2 g topically daily as needed (for pain).   Yes Historical Provider, MD  metFORMIN (GLUCOPHAGE-XR) 500 MG 24 hr tablet Take 500 mg by mouth daily with supper. 08/16/14  Yes Historical Provider, MD  Multiple Vitamin (MULTIVITAMIN) tablet Take 1 tablet by mouth daily.   Yes Historical Provider, MD  naproxen sodium (ANAPROX) 220 MG tablet Take 440 mg by mouth daily as needed (for pain).   Yes Historical Provider, MD  Pramoxine-Menthol (GOLD BOND MEDICATED ANTI ITCH) 1-1 % CREA Apply 1 application topically daily.   Yes Historical Provider, MD  Prasterone, DHEA, 25 MG TABS Take 25 mg by mouth daily.    Yes Historical Provider, MD  ondansetron (ZOFRAN ODT) 8 MG disintegrating tablet Take 1 tablet (8 mg total) by mouth every 8 (eight) hours as needed for nausea or vomiting. 09/20/14   Linwood Dibbles, MD  oxyCODONE-acetaminophen (PERCOCET/ROXICET) 5-325 MG per tablet Take 1-2 tablets by mouth every 6 (six) hours as needed. 09/20/14   Linwood Dibbles, MD   BP 114/79 mmHg  Pulse 87  Temp(Src) 98 F (36.7 C) (Oral)  Resp 16  Ht 6\' 2"  (  1.88 m)  Wt 309 lb 11.2 oz (140.479 kg)  BMI 39.75 kg/m2  SpO2 95% Physical Exam  Constitutional: He appears well-developed and well-nourished. No distress.  Obese  HENT:  Head: Normocephalic and atraumatic.  Right Ear: External ear normal.  Left Ear: External ear normal.  Eyes: Conjunctivae are normal. Right eye exhibits no discharge. Left eye exhibits no discharge. No scleral icterus.  Neck: Neck supple. No tracheal deviation present.  Cardiovascular: Normal rate, regular rhythm and intact distal pulses.   Pulmonary/Chest: Effort normal and breath sounds normal. No stridor. No respiratory distress. He has no wheezes. He has no rales.   Abdominal: Soft. Bowel sounds are normal. He exhibits no distension. There is tenderness in the epigastric area. There is no rigidity, no rebound and no guarding.  Musculoskeletal: He exhibits no edema or tenderness.  Neurological: He is alert. He has normal strength. No cranial nerve deficit (no facial droop, extraocular movements intact, no slurred speech) or sensory deficit. He exhibits normal muscle tone. He displays no seizure activity. Coordination normal.  Skin: Skin is warm and dry. No rash noted.  Psychiatric: He has a normal mood and affect.  Nursing note and vitals reviewed.   ED Course  Procedures (including critical care time) Labs Review Labs Reviewed  LIPASE, BLOOD - Abnormal; Notable for the following:    Lipase 990 (*)    All other components within normal limits  COMPREHENSIVE METABOLIC PANEL - Abnormal; Notable for the following:    Glucose, Bld 112 (*)    All other components within normal limits  CBC - Abnormal; Notable for the following:    WBC 13.7 (*)    All other components within normal limits  URINALYSIS, ROUTINE W REFLEX MICROSCOPIC (NOT AT [facility]) - Abnormal; Notable for the following:    Hgb urine dipstick MODERATE (*)    Bilirubin Urine SMALL (*)    Ketones, ur 15 (*)    All other components within normal limits  TROPONIN I  URINE MICROSCOPIC-ADD ON    Imaging Review Dg Chest 2 View  09/20/2014   CLINICAL DATA:  Centralized chest pain for 3 days.  EXAM: CHEST  2 VIEW  COMPARISON:  Chest radiograph 11/03/2013  FINDINGS: Anterior cervical spinal fusion hardware. Stable cardiac and mediastinal contours. No consolidative pulmonary opacities. No pleural effusion or pneumothorax. Mid thoracic spine degenerative changes.  IMPRESSION: No acute cardiopulmonary process.   Electronically Signed   By: Annia Belt M.D.   On: 09/20/2014 20:35   Ct Abdomen Pelvis W Contrast  09/20/2014   CLINICAL DATA:  Left lower quadrant abdominal pain for 6 days. Mid upper  abdominal pain for 3 days. Pancreatitis.  EXAM: CT ABDOMEN AND PELVIS WITH CONTRAST  TECHNIQUE: Multidetector CT imaging of the abdomen and pelvis was performed using the standard protocol following bolus administration of intravenous contrast.  CONTRAST:  OMNIPAQUE IOHEXOL 300 MG/ML  SOLN  COMPARISON:  None.  FINDINGS: Lower chest: The included lung bases are clear. No pleural effusion.  Liver: Diffusely decreased density consistent with steatosis. Mild sparing adjacent to the gallbladder fossa. No focal lesion.  Hepatobiliary: Gallbladder physiologically distended without calcified stone or pericholecystic inflammatory change. Common bile duct is normal.  Pancreas: No definite peripancreatic inflammatory change. No soft tissue stranding. No ductal dilatation. There is a lobulated soft tissue density in the retroperitoneum, appears separate from the pancreas in the region of the uncinate process that is adjacent to the IVC, just posterior to the portal vein, and abutting  the duodenum measuring approximately 3.4 x 1.9 x 5.1 cm. Small nodes in the porta hepatis.  Spleen: Normal.  Adrenal glands: No nodule.  Kidneys: Symmetric renal enhancement. No hydronephrosis. No focal renal abnormality.  Stomach/Bowel: Stomach physiologically distended. There are no dilated or thickened small bowel loops. The appendix is normal. Moderate volume of stool throughout the colon without colonic wall thickening. There is faint pericolonic fat stranding adjacent to the anti mesenteric border the junction of the descending and sigmoid colon. No underlying diverticular disease, disproportionate wall thickening or definite epiploic appendage.  Vascular/Lymphatic: Small upper retroperitoneal lymph nodes. Abdominal aorta is normal in caliber.  Reproductive: Prostate gland is normal in size.  Bladder: Physiologically distended.  Other: No free air, free fluid, or intra-abdominal fluid collection. Minimal fat in both inguinal canals.  Small fat containing umbilical hernia.  Musculoskeletal: There are no acute or suspicious osseous abnormalities. Degenerative change in the lower lumbar spine.  IMPRESSION: 1. No CT findings of acute pancreatitis. 2. Lobulated soft tissue density in the retroperitoneum in the region of the uncinate process that appears distinctly separate from the pancreas. Recommend further characterization with MRI. 3. Faint fat stranding in the left lower quadrant adjacent to the junction of the descending/sigmoid colon. There is no definite underlying bowel abnormality. This may reflect small focus of fat necrosis. There is no underlying diverticular disease. 4. Hepatic steatosis.   Electronically Signed   By: Rubye Oaks M.D.   On: 09/20/2014 23:34   I have personally reviewed and evaluated these images and lab results as part of my medical decision-making.   EKG Interpretation   Date/Time:  Tuesday September 20 2014 19:05:42 EDT Ventricular Rate:  98 PR Interval:  166 QRS Duration: 110 QT Interval:  358 QTC Calculation: 457 R Axis:   47 Text Interpretation:  Normal sinus rhythm Normal ECG No significant change  since last tracing Confirmed by Cormac Wint  MD-J, Sherrilyn Nairn ([id number]) on 09/20/2014  9:20:33 PM     Medications  0.9 %  sodium chloride infusion (1,000 mLs Intravenous New Bag/Given 09/20/14 2204)    Followed by  0.9 %  sodium chloride infusion (not administered)  HYDROmorphone (DILAUDID) injection 1 mg (1 mg Intravenous Given 09/20/14 2347)  gi cocktail (Maalox,Lidocaine,Donnatal) (30 mLs Oral Given 09/20/14 1942)  ondansetron (ZOFRAN) injection 4 mg (4 mg Intravenous Given 09/20/14 2204)  iohexol (OMNIPAQUE) 300 MG/ML solution 100 mL (100 mLs Intravenous Contrast Given 09/20/14 2257)    MDM   Final diagnoses:  Acute pancreatitis, unspecified pancreatitis type    Patient's laboratory tests are consistent with acute pancreatitis. His EKG and cardiac enzymes do not suggest a cardiac etiology. The  cause of the pancreatitis is unclear.  Plan on CT scan of the abdomen and pelvis for further evaluation.  The patient's CT scan shows an area of fat stranding in left lower quadrant. This may be the source of the patient's left lower abdominal pain. He does not have any evidence of pancreatitis or other significant abnormality.  Does have a soft tissue density in the retroperitoneum. I discussed the importance of outpatient follow-up with his primary care doctor to arrange for an MRI.  The patient still would like to go home. We will plan on outpatient treatment with oxycodone and Zofran. He will follow liquid diet. There have been some case reports of metformin causing pancreatitis. I'll have the patient stop that medication temporarily.   Linwood Dibbles, MD 09/20/14 2352

## 2014-09-21 NOTE — ED Notes (Signed)
Pt left with all his belongings and ambulated out of treatment area.  

## 2014-09-27 ENCOUNTER — Other Ambulatory Visit: Payer: Self-pay | Admitting: Gastroenterology

## 2014-09-27 DIAGNOSIS — R935 Abnormal findings on diagnostic imaging of other abdominal regions, including retroperitoneum: Secondary | ICD-10-CM

## 2014-10-09 ENCOUNTER — Ambulatory Visit
Admission: RE | Admit: 2014-10-09 | Discharge: 2014-10-09 | Disposition: A | Discharge status: Home / Self Care | Payer: BLUE CROSS/BLUE SHIELD | Source: Ambulatory Visit | Attending: Gastroenterology | Admitting: Gastroenterology

## 2014-10-09 DIAGNOSIS — R935 Abnormal findings on diagnostic imaging of other abdominal regions, including retroperitoneum: Secondary | ICD-10-CM

## 2014-10-09 MED ORDER — GADOBENATE DIMEGLUMINE 529 MG/ML IV SOLN
20.0000 mL | Freq: Once | INTRAVENOUS | Status: AC | PRN
  Administered 2014-10-09: 20 mL via INTRAVENOUS

## 2015-11-06 ENCOUNTER — Encounter (HOSPITAL_COMMUNITY): Payer: Self-pay

## 2015-11-06 ENCOUNTER — Emergency Department (HOSPITAL_COMMUNITY)
Admission: EM | Admit: 2015-11-06 | Discharge: 2015-11-06 | Disposition: A | Discharge status: Home / Self Care | Payer: No Typology Code available for payment source | Attending: Emergency Medicine | Admitting: Emergency Medicine

## 2015-11-06 DIAGNOSIS — S0086XA Insect bite (nonvenomous) of other part of head, initial encounter: Secondary | ICD-10-CM | POA: Diagnosis present

## 2015-11-06 DIAGNOSIS — Y939 Activity, unspecified: Secondary | ICD-10-CM | POA: Diagnosis not present

## 2015-11-06 DIAGNOSIS — Y929 Unspecified place or not applicable: Secondary | ICD-10-CM | POA: Diagnosis not present

## 2015-11-06 DIAGNOSIS — Z7982 Long term (current) use of aspirin: Secondary | ICD-10-CM | POA: Insufficient documentation

## 2015-11-06 DIAGNOSIS — W57XXXA Bitten or stung by nonvenomous insect and other nonvenomous arthropods, initial encounter: Secondary | ICD-10-CM | POA: Diagnosis not present

## 2015-11-06 DIAGNOSIS — Y999 Unspecified external cause status: Secondary | ICD-10-CM | POA: Diagnosis not present

## 2015-11-06 DIAGNOSIS — F17210 Nicotine dependence, cigarettes, uncomplicated: Secondary | ICD-10-CM | POA: Diagnosis not present

## 2015-11-06 DIAGNOSIS — Z7984 Long term (current) use of oral hypoglycemic drugs: Secondary | ICD-10-CM | POA: Insufficient documentation

## 2015-11-06 DIAGNOSIS — E119 Type 2 diabetes mellitus without complications: Secondary | ICD-10-CM | POA: Diagnosis not present

## 2015-11-06 MED ORDER — DOXYCYCLINE HYCLATE 100 MG PO TABS
100.0000 mg | ORAL_TABLET | Freq: Once | ORAL | Status: AC
  Administered 2015-11-06: 100 mg via ORAL
  Filled 2015-11-06: qty 1

## 2015-11-06 MED ORDER — MUPIROCIN CALCIUM 2 % NA OINT: TOPICAL_OINTMENT | NASAL | 0 refills | Status: DC

## 2015-11-06 MED ORDER — DOXYCYCLINE HYCLATE 100 MG PO CAPS: 100.0000 mg | ORAL_CAPSULE | Freq: Two times a day (BID) | ORAL | 0 refills | Status: DC

## 2015-11-06 NOTE — ED Notes (Signed)
Declined W/C at D/C and was escorted to lobby by RN. 

## 2015-11-06 NOTE — ED Triage Notes (Signed)
Per Pt, Pt reports noticing bump on the left side of the head on Saturday that "looked like a zit." On Saturday, he noticed some discoloration that worsened since then. Pt's mother is a NP and pt has been taken Keflex and placed medication on the bite. Pt report jaw pain and ear pain. Bite has extended from dime size to quarter size overnight. Denies fever.

## 2015-11-06 NOTE — ED Provider Notes (Signed)
[facility]-EMERGENCY DEPT Provider Note   CSN: [id number] Arrival date & time: 11/06/15  1429  By signing my name below, I, Clinton West, attest that this documentation has been prepared under the direction and in the presence of News CorporationSamantha Anuj Summons PA-C. Electronically Signed: Sonum West, Neurosurgeoncribe. 11/06/15. 3:29 PM.  History   Chief Complaint Chief Complaint  Patient presents with  . Insect Bite    The history is provided by the patient. No language interpreter was used.    HPI Comments: Clinton West is a 50 y.o. male who presents to the Emergency Department complaining of a bump to the left side of head for the past 4 days. He reports associated pain, redness and swelling to the left side of face. He states it initially seemed like a pimple but now believes it was a spider bite. He has taken Keflex without significant relief. He denies fever, cough.   Past Medical History:  Diagnosis Date  . Diabetes mellitus without complication (HCC)     There are no active problems to display for this patient.   Past Surgical History:  Procedure Laterality Date  . ANKLE SURGERY    . SPINAL FUSION  2012, 2013       Home Medications    Prior to Admission medications   Medication Sig Start Date End Date Taking? Authorizing Provider  amphetamine-dextroamphetamine (ADDERALL XR) 20 MG 24 hr capsule Take 20 mg by mouth 2 (two) times daily as needed (for ADD).  08/26/14   Historical Provider, MD  aspirin 325 MG EC tablet Take 325 mg by mouth daily.    Historical Provider, MD  Cholecalciferol (VITAMIN D3) 10000 UNITS capsule Take 10,000 Units by mouth daily.    Historical Provider, MD  Coenzyme Q10-Red Yeast Rice (CO Q-10 PLUS RED YEAST RICE PO) Take by mouth.    Historical Provider, MD  diclofenac sodium (VOLTAREN) 1 % GEL Apply 2 g topically daily as needed (for pain).    Historical Provider, MD  metFORMIN (GLUCOPHAGE-XR) 500 MG 24 hr tablet Take 500 mg by mouth daily with supper. 08/16/14    Historical Provider, MD  Multiple Vitamin (MULTIVITAMIN) tablet Take 1 tablet by mouth daily.    Historical Provider, MD  naproxen sodium (ANAPROX) 220 MG tablet Take 440 mg by mouth daily as needed (for pain).    Historical Provider, MD  ondansetron (ZOFRAN ODT) 8 MG disintegrating tablet Take 1 tablet (8 mg total) by mouth every 8 (eight) hours as needed for nausea or vomiting. 09/20/14   Linwood DibblesJon Knapp, MD  oxyCODONE-acetaminophen (PERCOCET/ROXICET) 5-325 MG per tablet Take 1-2 tablets by mouth every 6 (six) hours as needed. 09/20/14   Linwood DibblesJon Knapp, MD  Pramoxine-Menthol (GOLD BOND MEDICATED ANTI ITCH) 1-1 % CREA Apply 1 application topically daily.    Historical Provider, MD  Prasterone, DHEA, 25 MG TABS Take 25 mg by mouth daily.     Historical Provider, MD    Family History Family History  Problem Relation Age of Onset  . Diabetes Brother   . Diabetes Maternal Uncle   . Diabetes Maternal Grandmother   . Cancer Maternal Grandmother     Social History Social History  Substance Use Topics  . Smoking status: Current Every Day Smoker    Packs/day: 1.00    Types: Cigarettes  . Smokeless tobacco: Never Used  . Alcohol use Yes     Comment: socially     Allergies   Other   Review of Systems Review of Systems  Constitutional:  Negative for fever.  HENT: Negative for ear pain.   Respiratory: Negative for cough.   Skin: Positive for color change and wound.     Physical Exam Updated Vital Signs BP 117/61 (BP Location: Right Arm)   Pulse 100   Temp 97.9 F (36.6 C) (Oral)   Resp 16   Ht 6\' 2"  (1.88 m)   Wt (!) 313 lb 6 oz (142.1 kg)   SpO2 98%   BMI 40.23 kg/m   Physical Exam  Constitutional: He is oriented to person, place, and time. He appears well-developed and well-nourished. No distress.  HENT:  Head: Normocephalic and atraumatic.  TMs clear bilaterally  Eyes: Conjunctivae are normal. Right eye exhibits no discharge. Left eye exhibits no discharge. No scleral icterus.    Cardiovascular: Normal rate.   Pulmonary/Chest: Effort normal.  Neurological: He is alert and oriented to person, place, and time. Coordination normal.  Skin: Skin is warm and dry. No rash noted. He is not diaphoretic. No erythema. No pallor.  2 cm x 2 cm area of erythema on the left side of forehead. 2 small scabs in the center. No necrosis or eschar. No streaking. Patient has tenderness along the left side of face without obvious swelling.  Psychiatric: He has a normal mood and affect. His behavior is normal.  Nursing note and vitals reviewed.    ED Treatments / Results  DIAGNOSTIC STUDIES: Oxygen Saturation is 98% on RA, normal by my interpretation.    COORDINATION OF CARE: 3:29 PM Discussed treatment plan with pt at bedside and pt agreed to plan.    Labs (all labs ordered are listed, but only abnormal results are displayed) Labs Reviewed - No data to display  EKG  EKG Interpretation None       Radiology No results found.  Procedures Procedures (including critical care time)  Medications Ordered in ED Medications - No data to display   Initial Impression / Assessment and Plan / ED Course  I have reviewed the triage vital signs and the nursing notes.  Pertinent labs & imaging results that were available during my care of the patient were reviewed by me and considered in my medical decision making (see chart for details).  Clinical Course   Otherwise healthy 50 year old male presents to the ED today with concern for possible spider bite to left forehead. No insect was ever actually seen. He is taken a few doses of Keflex without relief of his symptoms. On exam he has a small area of erythema on his left forehead. 2 small scabs are noted to be in the center. No sign of necrosis of or eschar. Patient does not have any systemic symptoms at this time. He is afebrile and all vitals are stable. He overall appears well. Unable to tell whether this was actually insect bite.  We'll change antibiotic to doxycycline. Recommend wound recheck in the next 2-3 days. Return precautions outlined in patient discharge instructions.   Final Clinical Impressions(s) / ED Diagnoses   Final diagnoses:  Insect bite, initial encounter    New Prescriptions Discharge Medication List as of 11/06/2015  3:41 PM    START taking these medications   Details  doxycycline (VIBRAMYCIN) 100 MG capsule Take 1 capsule (100 mg total) by mouth 2 (two) times daily., Starting Mon 11/06/2015, Print    mupirocin nasal ointment (BACTROBAN) 2 % Apply in each nostril daily, Print       I personally performed the services described in this documentation, which was  scribed in my presence. The recorded information has been reviewed and is accurate.      Dub Mikes, PA-C 11/06/15 1600    Pricilla Loveless, MD 11/15/15 0100

## 2015-11-06 NOTE — Discharge Instructions (Signed)
Take antibiotics as prescribed. Follow up with urgent care with your primary care doctor in the next 2-3 days for a wound recheck. Return to the ED if you experience fever, shortness of breath, vomiting, spread of your wound/rash.

## 2016-03-08 DIAGNOSIS — M961 Postlaminectomy syndrome, not elsewhere classified: Secondary | ICD-10-CM | POA: Diagnosis not present

## 2016-03-08 DIAGNOSIS — S129XXA Fracture of neck, unspecified, initial encounter: Secondary | ICD-10-CM | POA: Diagnosis not present

## 2016-03-08 DIAGNOSIS — M542 Cervicalgia: Secondary | ICD-10-CM | POA: Diagnosis not present

## 2016-03-08 DIAGNOSIS — Z6841 Body Mass Index (BMI) 40.0 and over, adult: Secondary | ICD-10-CM | POA: Diagnosis not present

## 2016-05-08 DIAGNOSIS — M25561 Pain in right knee: Secondary | ICD-10-CM | POA: Diagnosis not present

## 2016-05-08 DIAGNOSIS — M7071 Other bursitis of hip, right hip: Secondary | ICD-10-CM | POA: Diagnosis not present

## 2016-07-01 DIAGNOSIS — M542 Cervicalgia: Secondary | ICD-10-CM | POA: Diagnosis not present

## 2016-07-01 DIAGNOSIS — M961 Postlaminectomy syndrome, not elsewhere classified: Secondary | ICD-10-CM | POA: Diagnosis not present

## 2016-08-21 DIAGNOSIS — Z79899 Other long term (current) drug therapy: Secondary | ICD-10-CM | POA: Diagnosis not present

## 2016-08-21 DIAGNOSIS — F909 Attention-deficit hyperactivity disorder, unspecified type: Secondary | ICD-10-CM | POA: Diagnosis not present

## 2016-08-21 DIAGNOSIS — E119 Type 2 diabetes mellitus without complications: Secondary | ICD-10-CM | POA: Diagnosis not present

## 2016-08-26 DIAGNOSIS — I83891 Varicose veins of right lower extremities with other complications: Secondary | ICD-10-CM | POA: Diagnosis not present

## 2016-08-26 DIAGNOSIS — I8311 Varicose veins of right lower extremity with inflammation: Secondary | ICD-10-CM | POA: Diagnosis not present

## 2016-10-03 DIAGNOSIS — M542 Cervicalgia: Secondary | ICD-10-CM | POA: Diagnosis not present

## 2016-10-03 DIAGNOSIS — M961 Postlaminectomy syndrome, not elsewhere classified: Secondary | ICD-10-CM | POA: Diagnosis not present

## 2016-10-03 DIAGNOSIS — R030 Elevated blood-pressure reading, without diagnosis of hypertension: Secondary | ICD-10-CM | POA: Diagnosis not present

## 2016-10-03 DIAGNOSIS — Z6837 Body mass index (BMI) 37.0-37.9, adult: Secondary | ICD-10-CM | POA: Diagnosis not present

## 2016-11-06 DIAGNOSIS — F909 Attention-deficit hyperactivity disorder, unspecified type: Secondary | ICD-10-CM | POA: Diagnosis not present

## 2016-12-25 DIAGNOSIS — Z6838 Body mass index (BMI) 38.0-38.9, adult: Secondary | ICD-10-CM | POA: Diagnosis not present

## 2016-12-25 DIAGNOSIS — M542 Cervicalgia: Secondary | ICD-10-CM | POA: Diagnosis not present

## 2016-12-25 DIAGNOSIS — R030 Elevated blood-pressure reading, without diagnosis of hypertension: Secondary | ICD-10-CM | POA: Diagnosis not present

## 2016-12-25 DIAGNOSIS — M961 Postlaminectomy syndrome, not elsewhere classified: Secondary | ICD-10-CM | POA: Diagnosis not present

## 2017-02-04 DIAGNOSIS — E785 Hyperlipidemia, unspecified: Secondary | ICD-10-CM | POA: Diagnosis not present

## 2017-02-04 DIAGNOSIS — F909 Attention-deficit hyperactivity disorder, unspecified type: Secondary | ICD-10-CM | POA: Diagnosis not present

## 2017-02-04 DIAGNOSIS — G59 Mononeuropathy in diseases classified elsewhere: Secondary | ICD-10-CM | POA: Diagnosis not present

## 2017-02-04 DIAGNOSIS — E119 Type 2 diabetes mellitus without complications: Secondary | ICD-10-CM | POA: Diagnosis not present

## 2017-02-04 LAB — LIPID PANEL
Cholesterol: 172 (ref 0–200)
HDL: 29 — AB (ref 35–70)
LDL Cholesterol: 89
LDl/HDL Ratio: 5.9
Triglycerides: 269 — AB (ref 40–160)

## 2017-02-04 LAB — BASIC METABOLIC PANEL
BUN: 14 (ref 4–21)
CO2: 28 — AB (ref 13–22)
Chloride: 102 (ref 99–108)
Creatinine: 0.8 (ref 0.6–1.3)
Glucose: 119
Potassium: 4.3 (ref 3.4–5.3)
Sodium: 137 (ref 137–147)

## 2017-02-04 LAB — COMPREHENSIVE METABOLIC PANEL
Albumin: 4.3 (ref 3.5–5.0)
Calcium: 9.7 (ref 8.7–10.7)
GFR calc Af Amer: 120
GFR calc non Af Amer: 100

## 2017-02-04 LAB — MICROALBUMIN, URINE: Microalb, Ur: <0.7

## 2017-02-04 LAB — HEMOGLOBIN A1C: Hemoglobin A1C: 6.3

## 2017-02-04 LAB — HEPATIC FUNCTION PANEL
ALT: 27 (ref 10–40)
AST: 16 (ref 14–40)

## 2017-03-17 DIAGNOSIS — Z6837 Body mass index (BMI) 37.0-37.9, adult: Secondary | ICD-10-CM | POA: Diagnosis not present

## 2017-03-17 DIAGNOSIS — M961 Postlaminectomy syndrome, not elsewhere classified: Secondary | ICD-10-CM | POA: Diagnosis not present

## 2017-03-17 DIAGNOSIS — M542 Cervicalgia: Secondary | ICD-10-CM | POA: Diagnosis not present

## 2017-03-17 DIAGNOSIS — S129XXA Fracture of neck, unspecified, initial encounter: Secondary | ICD-10-CM | POA: Diagnosis not present

## 2017-06-09 DIAGNOSIS — M961 Postlaminectomy syndrome, not elsewhere classified: Secondary | ICD-10-CM | POA: Diagnosis not present

## 2017-06-09 DIAGNOSIS — S129XXA Fracture of neck, unspecified, initial encounter: Secondary | ICD-10-CM | POA: Diagnosis not present

## 2017-06-09 DIAGNOSIS — M542 Cervicalgia: Secondary | ICD-10-CM | POA: Diagnosis not present

## 2017-06-17 DIAGNOSIS — E785 Hyperlipidemia, unspecified: Secondary | ICD-10-CM | POA: Diagnosis not present

## 2017-06-17 DIAGNOSIS — F909 Attention-deficit hyperactivity disorder, unspecified type: Secondary | ICD-10-CM | POA: Diagnosis not present

## 2017-06-17 DIAGNOSIS — E119 Type 2 diabetes mellitus without complications: Secondary | ICD-10-CM | POA: Diagnosis not present

## 2017-06-17 LAB — COMPREHENSIVE METABOLIC PANEL
Albumin: 4.3 (ref 3.5–5.0)
Calcium: 9.7 (ref 8.7–10.7)
GFR calc Af Amer: 136
GFR calc non Af Amer: 112

## 2017-06-17 LAB — HEPATIC FUNCTION PANEL
ALT: 31 (ref 10–40)
AST: 16 (ref 14–40)

## 2017-06-17 LAB — LIPID PANEL
Cholesterol: 171 (ref 0–200)
HDL: 33 — AB (ref 35–70)
LDL Cholesterol: 84
Triglycerides: 269 — AB (ref 40–160)

## 2017-06-17 LAB — BASIC METABOLIC PANEL
BUN: 15 (ref 4–21)
CO2: 26 — AB (ref 13–22)
Chloride: 102 (ref 99–108)
Creatinine: 0.7 (ref 0.6–1.3)
Glucose: 140
Potassium: 4.4 (ref 3.4–5.3)
Sodium: 137 (ref 137–147)

## 2017-06-17 LAB — HEMOGLOBIN A1C: Hemoglobin A1C: 6.1

## 2017-09-01 DIAGNOSIS — M961 Postlaminectomy syndrome, not elsewhere classified: Secondary | ICD-10-CM | POA: Diagnosis not present

## 2017-09-01 DIAGNOSIS — Z6837 Body mass index (BMI) 37.0-37.9, adult: Secondary | ICD-10-CM | POA: Diagnosis not present

## 2017-09-01 DIAGNOSIS — I10 Essential (primary) hypertension: Secondary | ICD-10-CM | POA: Diagnosis not present

## 2017-09-24 DIAGNOSIS — H1851 Endothelial corneal dystrophy: Secondary | ICD-10-CM | POA: Diagnosis not present

## 2017-09-24 DIAGNOSIS — E119 Type 2 diabetes mellitus without complications: Secondary | ICD-10-CM | POA: Diagnosis not present

## 2017-09-24 DIAGNOSIS — H5203 Hypermetropia, bilateral: Secondary | ICD-10-CM | POA: Diagnosis not present

## 2017-12-17 DIAGNOSIS — F988 Other specified behavioral and emotional disorders with onset usually occurring in childhood and adolescence: Secondary | ICD-10-CM | POA: Diagnosis not present

## 2017-12-17 DIAGNOSIS — E119 Type 2 diabetes mellitus without complications: Secondary | ICD-10-CM | POA: Diagnosis not present

## 2017-12-17 DIAGNOSIS — Z79899 Other long term (current) drug therapy: Secondary | ICD-10-CM | POA: Diagnosis not present

## 2017-12-17 DIAGNOSIS — E291 Testicular hypofunction: Secondary | ICD-10-CM | POA: Diagnosis not present

## 2018-01-13 DIAGNOSIS — M542 Cervicalgia: Secondary | ICD-10-CM | POA: Diagnosis not present

## 2018-01-13 DIAGNOSIS — M961 Postlaminectomy syndrome, not elsewhere classified: Secondary | ICD-10-CM | POA: Diagnosis not present

## 2018-01-13 DIAGNOSIS — Z6836 Body mass index (BMI) 36.0-36.9, adult: Secondary | ICD-10-CM | POA: Diagnosis not present

## 2018-01-13 DIAGNOSIS — R030 Elevated blood-pressure reading, without diagnosis of hypertension: Secondary | ICD-10-CM | POA: Diagnosis not present

## 2018-02-18 DIAGNOSIS — Z79899 Other long term (current) drug therapy: Secondary | ICD-10-CM | POA: Diagnosis not present

## 2018-02-18 DIAGNOSIS — F988 Other specified behavioral and emotional disorders with onset usually occurring in childhood and adolescence: Secondary | ICD-10-CM | POA: Diagnosis not present

## 2018-02-18 DIAGNOSIS — E119 Type 2 diabetes mellitus without complications: Secondary | ICD-10-CM | POA: Diagnosis not present

## 2018-02-18 LAB — COMPREHENSIVE METABOLIC PANEL
Albumin: 4.1 (ref 3.5–5.0)
Calcium: 9.2 (ref 8.7–10.7)
GFR calc Af Amer: 163
GFR calc non Af Amer: 135

## 2018-02-18 LAB — BASIC METABOLIC PANEL
BUN: 14 (ref 4–21)
CO2: 24 — AB (ref 13–22)
Chloride: 105 (ref 99–108)
Creatinine: 0.6 (ref 0.6–1.3)
Glucose: 108
Potassium: 4.4 (ref 3.4–5.3)
Sodium: 139 (ref 137–147)

## 2018-02-18 LAB — HEMOGLOBIN A1C: Hemoglobin A1C: 6.3

## 2018-02-18 LAB — HEPATIC FUNCTION PANEL
ALT: 18 (ref 10–40)
AST: 12 — AB (ref 14–40)
Alkaline Phosphatase: 62 (ref 25–125)

## 2018-02-18 LAB — CBC AND DIFFERENTIAL
HCT: 47 (ref 41–53)
Hemoglobin: 16.1 (ref 13.5–17.5)
Platelets: 229 (ref 150–399)
WBC: 9.5

## 2018-02-18 LAB — TESTOSTERONE: Testosterone: 202

## 2018-02-18 LAB — CBC: RBC: 5.02 (ref 3.87–5.11)

## 2018-10-02 LAB — CBC AND DIFFERENTIAL
HCT: 51 (ref 41–53)
Hemoglobin: 17 (ref 13.5–17.5)
Platelets: 212 (ref 150–399)
WBC: 9.3

## 2018-10-02 LAB — HEMOGLOBIN A1C: Hemoglobin A1C: 6.7

## 2018-10-02 LAB — HEPATIC FUNCTION PANEL
ALT: 34 (ref 10–40)
AST: 20 (ref 14–40)

## 2018-10-02 LAB — COMPREHENSIVE METABOLIC PANEL
GFR calc Af Amer: 113
GFR calc non Af Amer: 137

## 2018-10-02 LAB — CBC: RBC: 5.43 — AB (ref 3.87–5.11)

## 2018-10-02 LAB — BASIC METABOLIC PANEL: Creatinine: 0.7 (ref 0.6–1.3)

## 2018-11-17 ENCOUNTER — Other Ambulatory Visit: Payer: Self-pay

## 2018-11-17 DIAGNOSIS — Z20822 Contact with and (suspected) exposure to covid-19: Secondary | ICD-10-CM

## 2018-11-19 LAB — NOVEL CORONAVIRUS, NAA: SARS-CoV-2, NAA: NOT DETECTED

## 2018-12-10 ENCOUNTER — Other Ambulatory Visit: Payer: Self-pay | Admitting: Neurosurgery

## 2018-12-10 DIAGNOSIS — M961 Postlaminectomy syndrome, not elsewhere classified: Secondary | ICD-10-CM

## 2019-03-18 ENCOUNTER — Encounter: Payer: Self-pay | Admitting: Family Medicine

## 2019-06-01 ENCOUNTER — Other Ambulatory Visit: Payer: Self-pay

## 2019-06-01 ENCOUNTER — Encounter: Payer: Self-pay | Admitting: Family Medicine

## 2019-06-01 ENCOUNTER — Ambulatory Visit (INDEPENDENT_AMBULATORY_CARE_PROVIDER_SITE_OTHER): Payer: 59 | Admitting: Family Medicine

## 2019-06-01 VITALS — BP 118/78 | HR 88 | Temp 97.3°F | Ht 74.0 in | Wt 297.0 lb

## 2019-06-01 DIAGNOSIS — F909 Attention-deficit hyperactivity disorder, unspecified type: Secondary | ICD-10-CM | POA: Insufficient documentation

## 2019-06-01 DIAGNOSIS — M542 Cervicalgia: Secondary | ICD-10-CM

## 2019-06-01 DIAGNOSIS — F17200 Nicotine dependence, unspecified, uncomplicated: Secondary | ICD-10-CM | POA: Diagnosis not present

## 2019-06-01 DIAGNOSIS — E1165 Type 2 diabetes mellitus with hyperglycemia: Secondary | ICD-10-CM

## 2019-06-01 DIAGNOSIS — R7989 Other specified abnormal findings of blood chemistry: Secondary | ICD-10-CM

## 2019-06-01 DIAGNOSIS — G8929 Other chronic pain: Secondary | ICD-10-CM

## 2019-06-01 LAB — COMPREHENSIVE METABOLIC PANEL
ALT: 27 U/L (ref 0–53)
AST: 16 U/L (ref 0–37)
Albumin: 4.4 g/dL (ref 3.5–5.2)
Alkaline Phosphatase: 77 U/L (ref 39–117)
BUN: 14 mg/dL (ref 6–23)
CO2: 27 mEq/L (ref 19–32)
Calcium: 9.6 mg/dL (ref 8.4–10.5)
Chloride: 99 mEq/L (ref 96–112)
Creatinine, Ser: 0.7 mg/dL (ref 0.40–1.50)
GFR: 116.75 mL/min (ref >60.00)
Glucose, Bld: 132 mg/dL — ABNORMAL HIGH (ref 70–99)
Potassium: 4.4 mEq/L (ref 3.5–5.1)
Sodium: 136 mEq/L (ref 135–145)
Total Bilirubin: 0.6 mg/dL (ref 0.2–1.2)
Total Protein: 7.3 g/dL (ref 6.0–8.3)

## 2019-06-01 LAB — CBC
HCT: 48.3 % (ref 39.0–52.0)
Hemoglobin: 16.5 g/dL (ref 13.0–17.0)
MCHC: 34.2 g/dL (ref 30.0–36.0)
MCV: 92.1 fl (ref 78.0–100.0)
Platelets: 238 10*3/uL (ref 150.0–400.0)
RBC: 5.24 Mil/uL (ref 4.22–5.81)
RDW: 13.8 % (ref 11.5–15.5)
WBC: 15.6 10*3/uL — ABNORMAL HIGH (ref 4.0–10.5)

## 2019-06-01 LAB — HEMOGLOBIN A1C: Hgb A1c MFr Bld: 7.3 % — ABNORMAL HIGH (ref 4.6–6.5)

## 2019-06-01 LAB — TESTOSTERONE: Testosterone: 179.63 ng/dL — ABNORMAL LOW (ref 300.00–890.00)

## 2019-06-01 NOTE — Progress Notes (Signed)
Clinton West is a 54 y.o. male who presents today for an office visit.  Assessment/Plan:  Chronic Problems Addressed Today: Low testosterone Recheck testosterone level today.  Obtain records from previous PCP.  Would consider starting AndroGel positive still low.  Discussed risks and benefits of testosterone replacement.  We will also check CBC and C met today.  Attention deficit hyperactivity disorder (ADHD) Database with no red flags.  Does not need refill on Adderall today.  Type 2 diabetes mellitus with hyperglycemia, without long-term current use of insulin (HCC) Check A1c.  Continue Metformin 500 mg daily.  Chronic neck pain Continue management per neurosurgery.  Nicotine dependence with current use Patient was asked about his tobacco use today and was strongly advised to quit. Patient is currently contemplative. We reviewed treatment options to assist him quit smoking including NRT, Chantix, and Bupropion.  He will try weaning off with nicotine replacement therapy.  Follow up at next office visit.   Total time spent counseling approximately 5 minutes.       Subjective:  HPI:  Patient was found to have low testosterone over a year ago.  His previous physician did not start him on replacement yet.  He would like to start replacement.  He has had ongoing issues with fatigue and low energy.  Has also had issues with low libido.  He has also been smoking a pack per day for the past 40 years.  He would like to quit smoking.  He has tried nicotine replacement in the past with modest success.   His stable, chronic medical conditions are outlined below:  # ADHD - On adderall 52m twice daily and tolerating well - Medications help with ability to stay focused and on task - No reported side effects  # T2DM - On metformin 5066mdaily and tolerating well  # Chronic Neck Pain - Follows with neurosurgery - On hydrocodone as needed - also uses OTC creams as needed.    PMH:  The following were reviewed and entered/updated in epic: Past Medical History:  Diagnosis Date  . Arthritis   . Depression   . [place] without complication ([place]  . Glaucoma   . Hyperlipidemia    Patient Active Problem List   Diagnosis Date Noted  . Low testosterone 06/01/2019  . Attention deficit hyperactivity disorder (ADHD) 06/01/2019  . Type 2 diabetes mellitus with hyperglycemia, without long-term current use of insulin ([place]05/25/2021  . Chronic neck pain 06/01/2019  . Nicotine dependence with current use 06/01/2019   Past Surgical History:  Procedure Laterality Date  . ANKLE SURGERY    . SPINAL FUSION  2012, 2013    Family History  Problem Relation Age of Onset  . Diabetes Brother   . Depression Brother   . Hypertension Brother   . Diabetes Maternal Uncle   . Diabetes Maternal Grandmother   . Cancer Maternal Grandmother   . Hearing loss Maternal Grandmother   . Alcohol abuse Father   . COPD Father   . Depression Father   . Hyperlipidemia Father   . Depression Sister   . Drug abuse Sister   . Hearing loss Maternal Grandfather     Medications- reviewed and updated Current Outpatient Medications  Medication Sig Dispense Refill  . Cholecalciferol (VITAMIN D3) 10000 UNITS capsule Take 10,000 Units by mouth daily.    . Coenzyme Q10-Red Yeast Rice (CO Q-10 PLUS RED YEAST RICE PO) Take by mouth.    . metFORMIN (GLUCOPHAGE-XR) 500 MG  24 hr tablet Take 500 mg by mouth daily with supper.  0  . Multiple Vitamin (MULTIVITAMIN) tablet Take 1 tablet by mouth daily.    Marland Kitchen amphetamine-dextroamphetamine (ADDERALL XR) 20 MG 24 hr capsule Take 20 mg by mouth 2 (two) times daily as needed (for ADD).   0  . aspirin 325 MG EC tablet Take 325 mg by mouth daily.    . diclofenac sodium (VOLTAREN) 1 % GEL Apply 2 g topically daily as needed (for pain).    . naproxen sodium (ANAPROX) 220 MG tablet Take 440 mg by mouth daily as needed (for pain).    .  Pramoxine-Menthol (GOLD BOND MEDICATED ANTI ITCH) 1-1 % CREA Apply 1 application topically daily.     No current facility-administered medications for this visit.    Allergies-reviewed and updated Allergies  Allergen Reactions  . Other Other (See Comments)    Limes gives him a headache    Social History   Socioeconomic History  . Marital status: Married    Spouse name: Not on file  . Number of children: Not on file  . Years of education: Not on file  . Highest education level: Not on file  Occupational History  . Not on file  Tobacco Use  . Smoking status: Current Every Day Smoker    Packs/day: 1.00    Types: Cigarettes  . Smokeless tobacco: Never Used  Substance and Sexual Activity  . Alcohol use: Yes    Comment: socially  . Drug use: No  . Sexual activity: Not on file  Other Topics Concern  . Not on file  Social History Narrative  . Not on file   Social Determinants of Health   Financial Resource Strain:   . Difficulty of Paying Living Expenses:   Food Insecurity:   . Worried About Charity fundraiser in the Last Year:   . Arboriculturist in the Last Year:   Transportation Needs:   . Film/video editor (Medical):   Marland Kitchen Lack of Transportation (Non-Medical):   Physical Activity:   . Days of Exercise per Week:   . Minutes of Exercise per Session:   Stress:   . Feeling of Stress :   Social Connections:   . Frequency of Communication with Friends and Family:   . Frequency of Social Gatherings with Friends and Family:   . Attends Religious Services:   . Active Member of Clubs or Organizations:   . Attends Archivist Meetings:   Marland Kitchen Marital Status:            Objective:  Physical Exam: BP 118/78 (BP Location: Left Arm, Patient Position: Sitting, Cuff Size: Large)   Pulse 88   Temp (!) 97.3 F (36.3 C) (Temporal)   Ht _0  (1.88 m)   Wt 297 lb (134.7 kg)   SpO2 95%   BMI 38.13 kg/m   Gen: No acute distress, resting comfortably CV:  Regular rate and rhythm with no murmurs appreciated Pulm: Normal work of breathing, clear to auscultation bilaterally with no crackles, wheezes, or rhonchi Neuro: Grossly normal, moves all extremities Psych: Normal affect and thought content      Sharde Gover M. Jerline Pain, MD 06/01/2019 9:20 AM

## 2019-06-01 NOTE — Assessment & Plan Note (Signed)
Check A1c.  Continue Metformin 500 mg daily. 

## 2019-06-01 NOTE — Assessment & Plan Note (Signed)
Continue management per neurosurgery. 

## 2019-06-01 NOTE — Assessment & Plan Note (Signed)
Database with no red flags.  Does not need refill on Adderall today.

## 2019-06-01 NOTE — Patient Instructions (Signed)
It was very nice to see you today!  We will check blood work today.  We will likely start testosterone placement depending on the blood work.  Please continue to work on cutting down on your cigarettes.  We will see you back in 3 to 6 months depending on the results your blood work.  Please come back sooner if needed.  Take care, Dr [name]  Please try these tips to maintain a healthy lifestyle:   Eat at least 3 REAL meals and 1-2 snacks per day.  Aim for no more than 5 hours between eating.  If you eat breakfast, please do so within one hour of getting up.    Each meal should contain half fruits/vegetables, one quarter protein, and one quarter carbs (no bigger than a computer mouse)   Cut down on sweet beverages. This includes juice, soda, and sweet tea.     Drink at least 1 glass of water with each meal and aim for at least 8 glasses per day   Exercise at least 150 minutes every week.

## 2019-06-01 NOTE — Assessment & Plan Note (Signed)
Patient was asked about his tobacco use today and was strongly advised to quit. Patient is currently contemplative. We reviewed treatment options to assist him quit smoking including NRT, Chantix, and Bupropion.  He will try weaning off with nicotine replacement therapy.  Follow up at next office visit.   Total time spent counseling approximately 5 minutes.

## 2019-06-01 NOTE — Assessment & Plan Note (Signed)
Recheck testosterone level today.  Obtain records from previous PCP.  Would consider starting AndroGel positive still low.  Discussed risks and benefits of testosterone replacement.  We will also check CBC and C met today.

## 2019-06-02 ENCOUNTER — Telehealth: Payer: Self-pay | Admitting: Family Medicine

## 2019-06-02 ENCOUNTER — Other Ambulatory Visit: Payer: Self-pay | Admitting: Family Medicine

## 2019-06-02 ENCOUNTER — Other Ambulatory Visit: Payer: Self-pay

## 2019-06-02 DIAGNOSIS — D72828 Other elevated white blood cell count: Secondary | ICD-10-CM

## 2019-06-02 MED ORDER — TESTOSTERONE 40.5 MG/2.5GM (1.62%) TD GEL: TRANSDERMAL | 0 refills | Status: DC

## 2019-06-02 NOTE — Telephone Encounter (Signed)
This is a new prescription - please check with pharmacy to see what they need.  Katina Degree. [name], MD 06/02/2019 12:03 PM

## 2019-06-02 NOTE — Telephone Encounter (Signed)
Pt requesting  RxTestosterone refill

## 2019-06-02 NOTE — Telephone Encounter (Signed)
Anselm Jungling is calling from [employer] in regards to the patients medicine Testosterone (ANDROGEL) 40.5 MG/2.5GM (1.62%) GEL, states it comes in different packages and is needing clarification.

## 2019-06-02 NOTE — Progress Notes (Signed)
Please inform patient of the following:  Testosterone is low - I will send in testosterone gel as per our discussion during his office visit. His blood sugar is elevated - recommend increasing metformin to 1000mg  daily His white blood count is elevated. We need to recheck. Please ask him to come back in 1-2 weeks for a lab visit and place a future order for CBC with differential.  All of his other labs are NORMAL.  I would like to see him back in 3 months to follow up on his diabetes and testosterone.   . Katina Degree, MD 06/02/2019 9:59 AM

## 2019-06-02 NOTE — Telephone Encounter (Signed)
PA #[id number] send today 05/28/2018 Rx Testosterone 40.5mg /2.71m

## 2019-06-02 NOTE — Telephone Encounter (Signed)
Patient calling Would like to have more than 1 dose of medication for testosterone. Please advise.

## 2019-06-03 NOTE — Telephone Encounter (Signed)
PA completed for patient. Pt picked up prescription.

## 2019-06-09 ENCOUNTER — Telehealth: Payer: Self-pay | Admitting: *Deleted

## 2019-06-09 NOTE — Telephone Encounter (Signed)
Spoke with patient about Rx testosterone  Rx requesting changes send to pharmacy  Information send to PCP

## 2019-06-09 NOTE — Telephone Encounter (Signed)
Pharmacy requesting new Rx Testosterone 1.62 gel pump pack size 75 grams  If is possible.

## 2019-06-09 NOTE — Telephone Encounter (Signed)
Patient calling back to speak to Clinton West.  

## 2019-06-10 ENCOUNTER — Telehealth: Payer: Self-pay | Admitting: Family Medicine

## 2019-06-10 ENCOUNTER — Other Ambulatory Visit: Payer: Self-pay | Admitting: *Deleted

## 2019-06-10 MED ORDER — TESTOSTERONE 40.5 MG/2.5GM (1.62%) TD GEL: TRANSDERMAL | 0 refills | Status: DC

## 2019-06-10 NOTE — Telephone Encounter (Signed)
Clinton West is calling in stating they had a few more questions about the previous medication vs the new one.

## 2019-06-10 NOTE — Telephone Encounter (Signed)
[facility] is asking for a prior authorization for patients medicine -Testosterone (ANDROGEL) 40.5 MG/2.5GM (1.62%) GEL- states they need a clarification on the diagnosis code.

## 2019-06-10 NOTE — Telephone Encounter (Signed)
Rx sent.  Katina Degree. [name], MD 06/10/2019 8:39 AM

## 2019-06-10 NOTE — Telephone Encounter (Signed)
Ok with me.   Katina Degree. [name], MD 06/10/2019 10:02 AM

## 2019-06-10 NOTE — Telephone Encounter (Signed)
Pharmacy want to know if gel pump be ok for rx Testosterone  Please advise

## 2019-06-10 NOTE — Telephone Encounter (Signed)
Pharmacy notified.

## 2019-06-10 NOTE — Telephone Encounter (Signed)
LVM with information to Pt

## 2019-06-11 NOTE — Telephone Encounter (Signed)
Clarification Form Faxed

## 2019-06-14 ENCOUNTER — Other Ambulatory Visit: Payer: 59

## 2019-06-16 ENCOUNTER — Encounter: Payer: Self-pay | Admitting: Family Medicine

## 2019-06-17 ENCOUNTER — Encounter: Payer: Self-pay | Admitting: Family Medicine

## 2019-06-30 ENCOUNTER — Encounter: Payer: Self-pay | Admitting: Family Medicine

## 2019-06-30 ENCOUNTER — Other Ambulatory Visit: Payer: Self-pay

## 2019-06-30 ENCOUNTER — Telehealth: Payer: Self-pay | Admitting: Family Medicine

## 2019-06-30 ENCOUNTER — Ambulatory Visit (INDEPENDENT_AMBULATORY_CARE_PROVIDER_SITE_OTHER): Payer: 59 | Admitting: Family Medicine

## 2019-06-30 VITALS — BP 118/80 | HR 84 | Temp 97.9°F | Ht 74.0 in | Wt 299.2 lb

## 2019-06-30 DIAGNOSIS — R05 Cough: Secondary | ICD-10-CM

## 2019-06-30 DIAGNOSIS — R7989 Other specified abnormal findings of blood chemistry: Secondary | ICD-10-CM | POA: Diagnosis not present

## 2019-06-30 DIAGNOSIS — D72828 Other elevated white blood cell count: Secondary | ICD-10-CM

## 2019-06-30 DIAGNOSIS — R059 Cough, unspecified: Secondary | ICD-10-CM

## 2019-06-30 DIAGNOSIS — F909 Attention-deficit hyperactivity disorder, unspecified type: Secondary | ICD-10-CM

## 2019-06-30 LAB — CBC WITH DIFFERENTIAL/PLATELET
Basophils Absolute: 0.1 10*3/uL (ref 0.0–0.1)
Basophils Relative: 0.8 % (ref 0.0–3.0)
Eosinophils Absolute: 0.2 10*3/uL (ref 0.0–0.7)
Eosinophils Relative: 2 % (ref 0.0–5.0)
HCT: 46.4 % (ref 39.0–52.0)
Hemoglobin: 15.7 g/dL (ref 13.0–17.0)
Lymphocytes Relative: 36 % (ref 12.0–46.0)
Lymphs Abs: 3.9 10*3/uL (ref 0.7–4.0)
MCHC: 33.8 g/dL (ref 30.0–36.0)
MCV: 94.1 fl (ref 78.0–100.0)
Monocytes Absolute: 1 10*3/uL (ref 0.1–1.0)
Monocytes Relative: 9 % (ref 3.0–12.0)
Neutro Abs: 5.7 10*3/uL (ref 1.4–7.7)
Neutrophils Relative %: 52.2 % (ref 43.0–77.0)
Platelets: 230 10*3/uL (ref 150.0–400.0)
RBC: 4.93 Mil/uL (ref 4.22–5.81)
RDW: 14.1 % (ref 11.5–15.5)
WBC: 11 10*3/uL — ABNORMAL HIGH (ref 4.0–10.5)

## 2019-06-30 MED ORDER — AMPHETAMINE-DEXTROAMPHET ER 20 MG PO CP24: 20.0000 mg | ORAL_CAPSULE | Freq: Two times a day (BID) | ORAL | 0 refills | Status: DC | PRN

## 2019-06-30 NOTE — Progress Notes (Signed)
   Clinton West is a 54 y.o. male who presents today for an office visit.  Assessment/Plan:  New/Acute Problems: Cough Lungs clear. Likely postnasal drip. No signs of PNA or bronchitis. He will start claritin.   Chronic Problems Addressed Today: Low testosterone Doing well with testosterone replacement with androgel 40.5mg  once daily. Recheck CBC and testosterone in 3 months.   Attention deficit hyperactivity disorder (ADHD) Database with no red flags. Previous adderall rx found. Medications help with ability to stay focused and on task. Will refill today for adderall xr 20mg  twice daily. Follow up in 3 months.     Subjective:  HPI:  More trouble with focus and concentration. Adderall has worked well in the past but has been off for a few months due to financial concerns. Will be starting a new job soon and is worried about how is ADHD will affect his performance.  He has done well with testosterone replacement thus far. Minimal side effects.   He as also had occasional cough for the last few weeks. No fevers. No shortness of breath.        Objective:  Physical Exam: BP 118/80   Pulse 84   Temp 97.9 F (36.6 C) (Temporal)   Ht 6\' 2"  (1.88 m)   Wt 299 lb 3.2 oz (135.7 kg)   SpO2 97%   BMI 38.42 kg/m   Gen: No acute distress, resting comfortably CV: Regular rate and rhythm with no murmurs appreciated Pulm: Normal work of breathing, clear to auscultation bilaterally with no crackles, wheezes, or rhonchi Neuro: Grossly normal, moves all extremities Psych: Normal affect and thought content      Holle Sprick M. , MD 06/30/2019 8:39 AM

## 2019-06-30 NOTE — Patient Instructions (Addendum)
It was very nice to see you today!  We will restart your adderall today.  Try taking claritin for your cough.   Come back in 3 months, or sooner as needed.   Take care, Dr [name]  Please try these tips to maintain a healthy lifestyle:   Eat at least 3 REAL meals and 1-2 snacks per day.  Aim for no more than 5 hours between eating.  If you eat breakfast, please do so within one hour of getting up.    Each meal should contain half fruits/vegetables, one quarter protein, and one quarter carbs (no bigger than a computer mouse)   Cut down on sweet beverages. This includes juice, soda, and sweet tea.     Drink at least 1 glass of water with each meal and aim for at least 8 glasses per day   Exercise at least 150 minutes every week.

## 2019-06-30 NOTE — Assessment & Plan Note (Signed)
Doing well with testosterone replacement with androgel 40.5mg  once daily. Recheck CBC and testosterone in 3 months.

## 2019-06-30 NOTE — Assessment & Plan Note (Signed)
Database with no red flags. Previous adderall rx found. Medications help with ability to stay focused and on task. Will refill today for adderall xr 20mg  twice daily. Follow up in 3 months.

## 2019-06-30 NOTE — Addendum Note (Signed)
Addended by: Laddie Aquas A on: 06/30/2019 11:16 AM   Modules accepted: Orders

## 2019-06-30 NOTE — Telephone Encounter (Signed)
Form Received 3- 5 days to complete

## 2019-06-30 NOTE — Telephone Encounter (Signed)
Pt dropped off form from insurance company to be filled out by Dr. [name]. Attached charge form and sent back to [facility].

## 2019-07-01 NOTE — Progress Notes (Signed)
Please inform patient of the following:  Blood counts back to near baseline. Do not need to do any further testing at this point. We can recheck again in 3 months when he comes back for his follow up visit.

## 2019-07-26 ENCOUNTER — Encounter: Payer: Self-pay | Admitting: Family Medicine

## 2019-07-27 ENCOUNTER — Other Ambulatory Visit: Payer: Self-pay

## 2019-07-27 MED ORDER — METFORMIN HCL ER 500 MG PO TB24: 500.0000 mg | ORAL_TABLET | Freq: Every day | ORAL | 0 refills | Status: DC

## 2019-08-06 ENCOUNTER — Other Ambulatory Visit: Payer: Self-pay | Admitting: Family Medicine

## 2019-08-06 MED ORDER — TESTOSTERONE 40.5 MG/2.5GM (1.62%) TD GEL: TRANSDERMAL | 0 refills | Status: DC

## 2019-08-06 MED ORDER — AMPHETAMINE-DEXTROAMPHET ER 20 MG PO CP24: 20.0000 mg | ORAL_CAPSULE | Freq: Two times a day (BID) | ORAL | 0 refills | Status: DC | PRN

## 2019-08-06 NOTE — Telephone Encounter (Signed)
Pt requesting refills.

## 2019-09-27 ENCOUNTER — Ambulatory Visit: Payer: 59 | Admitting: Family Medicine

## 2019-10-12 ENCOUNTER — Ambulatory Visit: Payer: Self-pay | Admitting: Family Medicine

## 2019-10-13 ENCOUNTER — Ambulatory Visit: Payer: Self-pay | Admitting: Family Medicine

## 2019-10-13 ENCOUNTER — Other Ambulatory Visit: Payer: Self-pay

## 2019-11-01 ENCOUNTER — Encounter: Payer: Self-pay | Admitting: Family Medicine

## 2019-11-01 ENCOUNTER — Other Ambulatory Visit: Payer: Self-pay

## 2019-11-01 ENCOUNTER — Ambulatory Visit (INDEPENDENT_AMBULATORY_CARE_PROVIDER_SITE_OTHER): Payer: PRIVATE HEALTH INSURANCE | Admitting: Family Medicine

## 2019-11-01 VITALS — BP 119/71 | HR 82 | Temp 97.0°F | Ht 74.0 in | Wt 288.4 lb

## 2019-11-01 DIAGNOSIS — R7989 Other specified abnormal findings of blood chemistry: Secondary | ICD-10-CM

## 2019-11-01 DIAGNOSIS — N400 Enlarged prostate without lower urinary tract symptoms: Secondary | ICD-10-CM

## 2019-11-01 DIAGNOSIS — F909 Attention-deficit hyperactivity disorder, unspecified type: Secondary | ICD-10-CM | POA: Diagnosis not present

## 2019-11-01 DIAGNOSIS — E1165 Type 2 diabetes mellitus with hyperglycemia: Secondary | ICD-10-CM | POA: Diagnosis not present

## 2019-11-01 DIAGNOSIS — T148XXA Other injury of unspecified body region, initial encounter: Secondary | ICD-10-CM

## 2019-11-01 LAB — POCT GLYCOSYLATED HEMOGLOBIN (HGB A1C): Hemoglobin A1C: 6.3 % — AB (ref 4.0–5.6)

## 2019-11-01 MED ORDER — TAMSULOSIN HCL 0.4 MG PO CAPS: 0.4000 mg | ORAL_CAPSULE | Freq: Every day | ORAL | 3 refills | Status: DC

## 2019-11-01 NOTE — Progress Notes (Signed)
   Clinton West is a 54 y.o. male who presents today for an office visit.  Assessment/Plan:  New/Acute Problems: Skin lesion Reassured patient.  Appearance consistent with postinflammatory hyperpigmentation.  Discussed natural course.  Discussed reasons return to care  Foreign Body in Arm No red flags.  Given he is currently asymptomatic do not think this needs to be pursued further.  Offered surgical referral for excision if he is interested.  Patient declined for today.  Chronic Problems Addressed Today: BPH (benign prostatic hyperplasia) Check PSA.  Start Flomax.  Discussed potential side effects.  Will consider trial of Cialis if no improvement.  Type 2 diabetes mellitus with hyperglycemia, without long-term current use of insulin (HCC) A1c 6.3.  Congratulated patient on weight loss and lifestyle changes.  Recheck in 3 months.  Attention deficit hyperactivity disorder (ADHD) Doing well with Adderall 20 mg twice Clinton as needed.  Low testosterone   Doing much better with testosterone replacement.  We will recheck CBC and testosterone level today.     Subjective:  HPI:  See A/p for status of chronic conditions.  He has been having some issue with difficulty urinating.  Also some dribbling.  Concerned about prostate.  No reported fevers or chills.  No clear dysuria.  He also has a spot on his left leg he would like to have looked at.  Status started this visit.  Had some surrounding redness.  This is healed up but still has a dark spot left.  He also has foreign body in left arm.  This is been present for at least 30 years or so.  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  Had a foreign body in her skin at that time.       Objective:  Physical Exam: BP 119/71   Pulse 82   Temp (!) 97 F (36.1 C) (Temporal)   Ht 6\' 2"  (1.88 m)   Wt 288 lb 6.4 oz (130.8 kg)   SpO2 98%   BMI 37.03 kg/m   Wt Readings from  Last 3 Encounters:  11/01/19 288 lb 6.4 oz (130.8 kg)  06/30/19 299 lb 3.2 oz (135.7 kg)  06/01/19 297 lb (134.7 kg)    Gen: No acute distress, resting comfortably CV: Regular rate and rhythm with no murmurs appreciated Pulm: Normal work of breathing, clear to auscultation bilaterally with no crackles, wheezes, or rhonchi Skin: Postinflammatory hyper pigmentary changes on the left inner thigh.  Mobile 1 -20mm mass on left upper extremity Neuro: Grossly normal, moves all extremities Psych: Normal affect and thought content      Clinton West M. 3m, MD 11/01/2019 10:41 AM

## 2019-11-01 NOTE — Assessment & Plan Note (Signed)
Check PSA.  Start Flomax.  Discussed potential side effects.  Will consider trial of Cialis if no improvement.

## 2019-11-01 NOTE — Assessment & Plan Note (Signed)
A1c 6.3.  Congratulated patient on weight loss and lifestyle changes.  Recheck in 3 months.

## 2019-11-01 NOTE — Patient Instructions (Addendum)
It was very nice to see you today!  Please start the flomax. Let me know how it is working for you in a few weeks.  We will check blood work today.   Your a1c looks great today. Please conintue the metformin.  Let me know if you want to see a surgeon for the spot on your arm.  The spot on your leg will take a few more months to heal.  I will see you back in 3 months.   Take care, Dr [name]  Please try these tips to maintain a healthy lifestyle:   Eat at least 3 REAL meals and 1-2 snacks per day.  Aim for no more than 5 hours between eating.  If you eat breakfast, please do so within one hour of getting up.    Each meal should contain half fruits/vegetables, one quarter protein, and one quarter carbs (no bigger than a computer mouse)   Cut down on sweet beverages. This includes juice, soda, and sweet tea.     Drink at least 1 glass of water with each meal and aim for at least 8 glasses per day   Exercise at least 150 minutes every week.

## 2019-11-01 NOTE — Assessment & Plan Note (Signed)
Doing well with Adderall 20 mg twice daily as needed.

## 2019-11-01 NOTE — Assessment & Plan Note (Signed)
Doing much better with testosterone replacement.  We will recheck CBC and testosterone level today.

## 2019-11-02 LAB — CBC
HCT: 46.9 % (ref 38.5–50.0)
Hemoglobin: 16.5 g/dL (ref 13.2–17.1)
MCH: 32.7 pg (ref 27.0–33.0)
MCHC: 35.2 g/dL (ref 32.0–36.0)
MCV: 92.9 fL (ref 80.0–100.0)
MPV: 10.2 fL (ref 7.5–12.5)
Platelets: 220 10*3/uL (ref 140–400)
RBC: 5.05 10*6/uL (ref 4.20–5.80)
RDW: 12.8 % (ref 11.0–15.0)
WBC: 8.3 10*3/uL (ref 3.8–10.8)

## 2019-11-02 LAB — TESTOSTERONE: Testosterone: 410 ng/dL (ref 250–827)

## 2019-11-02 LAB — PSA: PSA: 0.76 ng/mL (ref <=4.0)

## 2019-11-02 NOTE — Progress Notes (Signed)
Please inform patient of the following:  Testosterone levels are back to NORMAL and all of his other labs are stable. Would like for him to continue  with his current treatment plan and we can recheck in 6-12 months.

## 2019-11-06 ENCOUNTER — Other Ambulatory Visit: Payer: Self-pay | Admitting: Family Medicine

## 2019-11-08 ENCOUNTER — Other Ambulatory Visit: Payer: Self-pay | Admitting: Family Medicine

## 2019-11-09 MED ORDER — AMPHETAMINE-DEXTROAMPHET ER 20 MG PO CP24
20.0000 mg | ORAL_CAPSULE | Freq: Two times a day (BID) | ORAL | 0 refills | Status: DC | PRN
Start: 2019-11-09 — End: 2019-12-09

## 2019-12-09 ENCOUNTER — Other Ambulatory Visit: Payer: Self-pay | Admitting: Family Medicine

## 2019-12-10 MED ORDER — TESTOSTERONE 40.5 MG/2.5GM (1.62%) TD GEL
TRANSDERMAL | 0 refills | Status: DC
Start: 2019-12-10 — End: 2020-01-27

## 2019-12-10 MED ORDER — AMPHETAMINE-DEXTROAMPHET ER 20 MG PO CP24: 20.0000 mg | ORAL_CAPSULE | Freq: Two times a day (BID) | ORAL | 0 refills | Status: DC | PRN

## 2020-01-18 ENCOUNTER — Other Ambulatory Visit: Payer: Self-pay | Admitting: Family Medicine

## 2020-01-18 MED ORDER — AMPHETAMINE-DEXTROAMPHET ER 20 MG PO CP24
20.0000 mg | ORAL_CAPSULE | Freq: Two times a day (BID) | ORAL | 0 refills | Status: DC | PRN
Start: 2020-01-18 — End: 2020-02-21

## 2020-01-18 NOTE — Telephone Encounter (Signed)
LR: 12-10-2019 Qty: 60 with 0 refills  Last office visit: 11-01-2019 Upcoming appointment: 02-01-2020

## 2020-01-21 ENCOUNTER — Other Ambulatory Visit: Payer: Self-pay

## 2020-01-21 ENCOUNTER — Ambulatory Visit: Payer: PRIVATE HEALTH INSURANCE | Admitting: Family Medicine

## 2020-01-21 ENCOUNTER — Encounter: Payer: Self-pay | Admitting: Family Medicine

## 2020-01-21 VITALS — BP 116/76 | HR 80 | Temp 97.3°F | Ht 74.0 in | Wt 287.0 lb

## 2020-01-21 DIAGNOSIS — R1032 Left lower quadrant pain: Secondary | ICD-10-CM

## 2020-01-21 DIAGNOSIS — E1165 Type 2 diabetes mellitus with hyperglycemia: Secondary | ICD-10-CM | POA: Diagnosis not present

## 2020-01-21 NOTE — Patient Instructions (Signed)
It was very nice to see you today!  I am concerned that she may have a hernia.  We will check an ultrasound to further evaluate for this.  We may need to send you to see a surgeon depending on the results of your ultrasound.  Take care, Dr [name]  Please try these tips to maintain a healthy lifestyle:   Eat at least 3 REAL meals and 1-2 snacks per day.  Aim for no more than 5 hours between eating.  If you eat breakfast, please do so within one hour of getting up.    Each meal should contain half fruits/vegetables, one quarter protein, and one quarter carbs (no bigger than a computer mouse)   Cut down on sweet beverages. This includes juice, soda, and sweet tea.     Drink at least 1 glass of water with each meal and aim for at least 8 glasses per day   Exercise at least 150 minutes every week.

## 2020-01-21 NOTE — Progress Notes (Signed)
   DRUE HARR is a 55 y.o. male who presents today for an office visit.  Assessment/Plan:  New/Acute Problems: LLQ Pain Concern for hernia.  No red flags or signs incarceration.  Will check ultrasound to further evaluate.  May need surgical referral depending on results.  He has been taking Norco for neck pain which is also been treating his abdominal pain.  Chronic Problems Addressed Today: Type 2 diabetes mellitus with hyperglycemia, without long-term current use of insulin (HCC) He will come back soon to recheck A1c.     Subjective:  HPI:  Pain in  LLQ that started about a month ago. Getting worse. No precipitating events. Does some lifting at work but not straining. Nothing noticeable makes it better or worse. Sometimes worse after work. No constipation or diarrhea. No nausea or vomiting.        Objective:  Physical Exam: BP 116/76   Pulse 80   Temp (!) 97.3 F (36.3 C) (Temporal)   Ht 6\' 2"  (1.88 m)   Wt 287 lb (130.2 kg)   SpO2 98%   BMI 36.85 kg/m   Gen: No acute distress, resting comfortably CV: Regular rate and rhythm with no murmurs appreciated Pulm: Normal work of breathing, clear to auscultation bilaterally with no crackles, wheezes, or rhonchi GI: Abdomen soft, nontender, nondistended.  Very tender to palpation along left inguinal crease.  Mild bulge noted with Valsalva. Neuro: Grossly normal, moves all extremities Psych: Normal affect and thought content      Rosaria Kubin M. , MD 01/21/2020 10:28 AM

## 2020-01-21 NOTE — Assessment & Plan Note (Signed)
He will come back soon to recheck A1c.

## 2020-01-27 ENCOUNTER — Other Ambulatory Visit: Payer: Self-pay | Admitting: Family Medicine

## 2020-01-28 MED ORDER — TESTOSTERONE 40.5 MG/2.5GM (1.62%) TD GEL: TRANSDERMAL | 0 refills | Status: DC

## 2020-02-01 ENCOUNTER — Ambulatory Visit: Payer: PRIVATE HEALTH INSURANCE | Admitting: Family Medicine

## 2020-02-01 DIAGNOSIS — Z0289 Encounter for other administrative examinations: Secondary | ICD-10-CM

## 2020-02-21 ENCOUNTER — Other Ambulatory Visit: Payer: Self-pay | Admitting: Family Medicine

## 2020-02-24 ENCOUNTER — Telehealth: Payer: Self-pay

## 2020-02-24 MED ORDER — AMPHETAMINE-DEXTROAMPHET ER 20 MG PO CP24: 20.0000 mg | ORAL_CAPSULE | Freq: Two times a day (BID) | ORAL | 0 refills | Status: DC | PRN

## 2020-02-24 NOTE — Addendum Note (Signed)
Addended by: Ardith Dark on: 02/24/2020 11:29 AM   Modules accepted: Orders

## 2020-02-24 NOTE — Telephone Encounter (Signed)
Requesting Rx Aderall refill

## 2020-02-24 NOTE — Telephone Encounter (Signed)
..   LAST APPOINTMENT DATE: 01/21/2020   NEXT APPOINTMENT DATE:@2 /23/2022  MEDICATION:amphetamine-dextroamphetamine (ADDERALL XR) 20 MG 24 hr capsule

## 2020-02-29 ENCOUNTER — Other Ambulatory Visit: Payer: Self-pay | Admitting: Family Medicine

## 2020-03-01 ENCOUNTER — Other Ambulatory Visit: Payer: Self-pay

## 2020-03-01 ENCOUNTER — Other Ambulatory Visit: Payer: Self-pay | Admitting: *Deleted

## 2020-03-01 ENCOUNTER — Telehealth: Payer: Self-pay

## 2020-03-01 ENCOUNTER — Encounter: Payer: Self-pay | Admitting: Family Medicine

## 2020-03-01 ENCOUNTER — Ambulatory Visit (INDEPENDENT_AMBULATORY_CARE_PROVIDER_SITE_OTHER): Payer: 59 | Admitting: Family Medicine

## 2020-03-01 VITALS — BP 111/72 | HR 85 | Temp 98.0°F | Ht 74.0 in | Wt 288.8 lb

## 2020-03-01 DIAGNOSIS — E1165 Type 2 diabetes mellitus with hyperglycemia: Secondary | ICD-10-CM

## 2020-03-01 DIAGNOSIS — N529 Male erectile dysfunction, unspecified: Secondary | ICD-10-CM

## 2020-03-01 DIAGNOSIS — M25511 Pain in right shoulder: Secondary | ICD-10-CM | POA: Diagnosis not present

## 2020-03-01 DIAGNOSIS — F909 Attention-deficit hyperactivity disorder, unspecified type: Secondary | ICD-10-CM

## 2020-03-01 LAB — POCT GLYCOSYLATED HEMOGLOBIN (HGB A1C): Hemoglobin A1C: 6.2 % — AB (ref 4.0–5.6)

## 2020-03-01 MED ORDER — TADALAFIL 10 MG PO TABS: 5.0000 mg | ORAL_TABLET | Freq: Every day | ORAL | 0 refills | Status: DC | PRN

## 2020-03-01 MED ORDER — METFORMIN HCL ER 500 MG PO TB24: ORAL_TABLET | ORAL | 3 refills | Status: DC

## 2020-03-01 NOTE — Telephone Encounter (Signed)
MEDICATION: metFORMIN (GLUCOPHAGE-XR) 500 MG 24 hr tablet  PHARMACY:  [facility] [address] [place] - [address] Phone:  [phone]  Fax:  [phone]       Comments:   **Let patient know to contact pharmacy at the end of the day to make sure medication is ready. **  ** Please notify patient to allow 48-72 hours to process**  **Encourage patient to contact the pharmacy for refills or they can request refills through [facility]**

## 2020-03-01 NOTE — Telephone Encounter (Signed)
Rx was send  

## 2020-03-01 NOTE — Assessment & Plan Note (Signed)
On Adderall 20 mg twice daily.  Felt like it wears off a bit early and is interested in possibly trying a stronger dose.  He is tolerating well without side effects.  We will continue current dose for now and try 25 mg twice daily on his next refill.  He will follow-up in 3 months.  Database with no red flags.

## 2020-03-01 NOTE — Assessment & Plan Note (Signed)
A1c stable at 6.2.  Continue Metformin 500 mg daily.  Discussed lifestyle modifications.

## 2020-03-01 NOTE — Telephone Encounter (Signed)
..   LAST APPOINTMENT DATE: 03/01/2020   NEXT APPOINTMENT DATE:@5 /24/2022  MEDICATION:metFORMIN (GLUCOPHAGE-XR) 500 MG 24 hr tablet

## 2020-03-01 NOTE — Patient Instructions (Signed)
It was very nice to see you today!  I will place a referral for you to see sports medicine for your shoulder.  Please try the Cialis.  Please let me know when you need refill on your Adderall and we can adjust the dose as needed.  I will see you back in 3 months.  Please come back to see me sooner if needed.  Take care, Dr [name]  Please try these tips to maintain a healthy lifestyle:   Eat at least 3 REAL meals and 1-2 snacks per day.  Aim for no more than 5 hours between eating.  If you eat breakfast, please do so within one hour of getting up.    Each meal should contain half fruits/vegetables, one quarter protein, and one quarter carbs (no bigger than a computer mouse)   Cut down on sweet beverages. This includes juice, soda, and sweet tea.     Drink at least 1 glass of water with each meal and aim for at least 8 glasses per day   Exercise at least 150 minutes every week.

## 2020-03-01 NOTE — Telephone Encounter (Signed)
Rx send to pharmacy  

## 2020-03-01 NOTE — Progress Notes (Signed)
   Clinton West is a 55 y.o. male who presents today for an office visit.  Assessment/Plan:  New/Acute Problems: Shoulder Pain Concern for rotator cuff tear or strain.  Will place referral to sports medicine for further evaluation.  Chronic Problems Addressed Today: Erectile dysfunction Not controlled.  Will start Cialis 5 to 10 mg daily.  Discussed potential side effects.  Type 2 diabetes mellitus with hyperglycemia, without long-term current use of insulin (HCC) A1c stable at 6.2.  Continue Metformin 500 mg daily.  Discussed lifestyle modifications.  Attention deficit hyperactivity disorder (ADHD) On Adderall 20 mg twice daily.  Felt like it wears off a bit early and is interested in possibly trying a stronger dose.  He is tolerating well without side effects.  We will continue current dose for now and try 25 mg twice daily on his next refill.  He will follow-up in 3 months.  Database with no red flags.     Subjective:  HPI:  See A/P for status of chronic conditions.  He injured his right shoulder a few weeks ago.  Slipped on the ice.  Arm was behind him.  Has had pain to his right shoulder since.  Occasionally has popping and cracking.  Pain is improved a little bit though is worse with certain motions.  No specific treatments tried.       Objective:  Physical Exam: BP 111/72   Pulse 85   Temp 98 F (36.7 C) (Temporal)   Ht 6\' 2"  (1.88 m)   Wt 288 lb 12.8 oz (131 kg)   SpO2 97%   BMI 37.08 kg/m   Gen: No acute distress, resting comfortably MSK: Right shoulder with no abnormalities.  Tender to palpation along anterior edge.  Normal supraspinatus testing.  Pain elicited with external rotation. Neuro: Grossly normal, moves all extremities Psych: Normal affect and thought content      Caleb M. , MD 03/01/2020 12:10 PM

## 2020-03-01 NOTE — Assessment & Plan Note (Signed)
Not controlled.  Will start Cialis 5 to 10 mg daily.  Discussed potential side effects.

## 2020-03-08 ENCOUNTER — Encounter: Payer: Self-pay | Admitting: Family Medicine

## 2020-03-08 ENCOUNTER — Ambulatory Visit: Payer: Self-pay

## 2020-03-08 ENCOUNTER — Ambulatory Visit (INDEPENDENT_AMBULATORY_CARE_PROVIDER_SITE_OTHER): Payer: 59 | Admitting: Family Medicine

## 2020-03-08 ENCOUNTER — Ambulatory Visit (INDEPENDENT_AMBULATORY_CARE_PROVIDER_SITE_OTHER): Payer: 59

## 2020-03-08 ENCOUNTER — Ambulatory Visit: Payer: 59

## 2020-03-08 ENCOUNTER — Other Ambulatory Visit: Payer: Self-pay

## 2020-03-08 VITALS — BP 128/86 | HR 86 | Ht 74.0 in | Wt 290.2 lb

## 2020-03-08 DIAGNOSIS — M25511 Pain in right shoulder: Secondary | ICD-10-CM

## 2020-03-08 NOTE — Progress Notes (Signed)
Subjective:    I'm seeing this patient as a consultation for:  Dr. [name]. Note will be routed back to referring provider/PCP.  CC: R shoulder pain  I, Molly Weber, LAT, ATC, am serving as scribe for Dr. Clementeen Graham.  HPI: Pt is a 55 y/o male presenting w/ R shoulder pain x approximately one month after he slipped and fell on the ice.  He locates his pain to the anterior aspect of R shoulder. Pt fell onto R arm with should in IR. Of note, pt has a hx of cervical laminectomy in 2014. Pt works in [employer].  Radiating pain: no R shoulder mechanical symptoms: yes Aggravating factors: ABD and flex about 90 Treatments tried: stretching  Past medical history, Surgical history, Family history, Social history, Allergies, and medications have been entered into the medical record, reviewed.   Review of Systems: No new headache, visual changes, nausea, vomiting, diarrhea, constipation, dizziness, abdominal pain, skin rash, fevers, chills, night sweats, weight loss, swollen lymph nodes, body aches, joint swelling, muscle aches, chest pain, shortness of breath, mood changes, visual or auditory hallucinations.   Objective:    Vitals:   03/08/20 1103  BP: 128/86  Pulse: 86  SpO2: 99%   General: Well Developed, well nourished, and in no acute distress.  Neuro/Psych: Alert and oriented x3, extra-ocular muscles intact, able to move all 4 extremities, sensation grossly intact. Skin: Warm and dry, no rashes noted.  Respiratory: Not using accessory muscles, speaking in full sentences, trachea midline.  Cardiovascular: Pulses palpable, no extremity edema. Abdomen: Does not appear distended. MSK: Right shoulder normal-appearing Range of motion normal abduction.  Internal rotation to lumbar spine normal external rotation. Strength 4/5 to abduction and external rotation.  5/5 internal rotation. Mildly positive Hawkins and Neer's test.  Positive crossover arm compression test. Negative Yergason's  and speeds test.  Lab and Radiology Results  X-ray images right shoulder obtained today personally and independently interpreted No fractures.  No significant glenohumeral DJD.  Moderate AC DJD. Await formal radiology review  Procedure: Real-time Ultrasound Guided Injection of right shoulder subacromial bursa Device: Philips Affiniti 50G Images permanently stored and available for review in PACS Ultrasound evaluation prior to injection reveals intact subscapularis supraspinatus and infraspinatus tendons without large rotator cuff tear or retraction.  Small hypoechoic change present at distal infraspinatus superior portion of tendon indicates possible nonretracted partial tear. Moderate subacromial bursitis is present. Verbal informed consent obtained.  Discussed risks and benefits of procedure. Warned about infection bleeding damage to structures skin hypopigmentation and fat atrophy among others. Patient expresses understanding and agreement Time-out conducted.   Noted no overlying erythema, induration, or other signs of local infection.   Skin prepped in a sterile fashion.   Local anesthesia: Topical Ethyl chloride.   With sterile technique and under real time ultrasound guidance:  40 mg of Kenalog and 2 mL of Marcaine injected into subacromial bursa. Fluid seen entering the bursa.   Completed without difficulty   Pain immediately resolved suggesting accurate placement of the medication.   Advised to call if fevers/chills, erythema, induration, drainage, or persistent bleeding.   Images permanently stored and available for review in the ultrasound unit.  Impression: Technically successful ultrasound guided injection.       Impression and Recommendations:    Assessment and Plan: 55 y.o. male with right shoulder pain occurring 1 month after fall.  Pain thought to be due predominantly to prior subacromial bursitis.  Possible partial small tear on rotator cuff tendon  visible but no  large full-thickness retracted tear.  Plan for injection home exercise program and physical therapy.  Recheck back in about 6 weeks if not improved.  Neck step would be considering MRI.Marland Kitchen  PDMP not reviewed this encounter. Orders Placed This Encounter  Procedures  . [place] LIMITED JOINT SPACE STRUCTURES UP RIGHT(NO LINKED CHARGES)    Standing Status:   Future    Number of Occurrences:   1    Standing Expiration Date:   09/08/2020    Order Specific Question:   Reason for Exam (SYMPTOM  OR DIAGNOSIS REQUIRED)    Answer:   Right shoulder pain    Order Specific Question:   Preferred imaging location?    Answer:   Adult nurse [facility]  . DG Shoulder Right    Standing Status:   Future    Number of Occurrences:   1    Standing Expiration Date:   03/08/2021    Order Specific Question:   Reason for Exam (SYMPTOM  OR DIAGNOSIS REQUIRED)    Answer:   right shoulder pain    Order Specific Question:   Preferred imaging location?    Answer:   [facility]  . Ambulatory referral to Physical Therapy    Referral Priority:   Routine    Referral Type:   Physical Medicine    Referral Reason:   Specialty Services Required    Requested Specialty:   Physical Therapy   No orders of the defined types were placed in this encounter.   Discussed warning signs or symptoms. Please see discharge instructions. Patient expresses understanding.   The above documentation has been reviewed and is accurate and complete Clementeen Graham, M.D.

## 2020-03-08 NOTE — Patient Instructions (Signed)
Thank you for coming in today.  Please get an Xray today before you leave  Call or go to the ER if you develop a large red swollen joint with extreme pain or oozing puss.   I've referred you to Physical Therapy.  Let us know if you don't hear from them in one week.  Recheck in 6 weeks if not improved.

## 2020-03-09 NOTE — Progress Notes (Signed)
X-ray right shoulder shows mild arthritis of the small joint at the top of the shoulder but otherwise looks normal to radiology

## 2020-03-14 ENCOUNTER — Other Ambulatory Visit: Payer: Self-pay

## 2020-03-23 ENCOUNTER — Other Ambulatory Visit: Payer: Self-pay | Admitting: Family Medicine

## 2020-03-23 NOTE — Telephone Encounter (Signed)
Rx Request 

## 2020-03-24 MED ORDER — TESTOSTERONE 40.5 MG/2.5GM (1.62%) TD GEL: TRANSDERMAL | 0 refills | Status: DC

## 2020-03-24 MED ORDER — AMPHETAMINE-DEXTROAMPHET ER 20 MG PO CP24: 20.0000 mg | ORAL_CAPSULE | Freq: Two times a day (BID) | ORAL | 0 refills | Status: DC | PRN

## 2020-03-29 ENCOUNTER — Other Ambulatory Visit: Payer: Self-pay | Admitting: Family Medicine

## 2020-04-28 ENCOUNTER — Other Ambulatory Visit: Payer: Self-pay | Admitting: Family Medicine

## 2020-04-28 MED ORDER — AMPHETAMINE-DEXTROAMPHET ER 20 MG PO CP24: 20.0000 mg | ORAL_CAPSULE | Freq: Two times a day (BID) | ORAL | 0 refills | Status: DC | PRN

## 2020-05-03 ENCOUNTER — Other Ambulatory Visit: Payer: Self-pay | Admitting: Family Medicine

## 2020-05-08 ENCOUNTER — Encounter: Payer: Self-pay | Admitting: Family Medicine

## 2020-05-08 LAB — HM DIABETES EYE EXAM

## 2020-05-09 ENCOUNTER — Other Ambulatory Visit: Payer: Self-pay | Admitting: Neurosurgery

## 2020-05-09 DIAGNOSIS — M961 Postlaminectomy syndrome, not elsewhere classified: Secondary | ICD-10-CM

## 2020-05-10 ENCOUNTER — Telehealth: Payer: Self-pay

## 2020-05-10 NOTE — Progress Notes (Signed)
Phone call to patient to verify medication list and allergies for myelogram procedure. Pt instructed to hold Adderall for 48hrs prior to myelogram appointment time and 24 hours after appointment. Pt also instructed to have a driver the day of the procedure, the procedure would take around 2 hours, and discharge instructions discussed. Pt verbalized understanding.

## 2020-05-17 ENCOUNTER — Ambulatory Visit
Admission: RE | Admit: 2020-05-17 | Discharge: 2020-05-17 | Disposition: A | Discharge status: Home / Self Care | Payer: BC Managed Care – PPO | Source: Ambulatory Visit | Attending: Neurosurgery | Admitting: Neurosurgery

## 2020-05-17 ENCOUNTER — Other Ambulatory Visit: Payer: Self-pay

## 2020-05-17 DIAGNOSIS — M961 Postlaminectomy syndrome, not elsewhere classified: Secondary | ICD-10-CM

## 2020-05-17 MED ORDER — DIAZEPAM 5 MG PO TABS
10.0000 mg | ORAL_TABLET | Freq: Once | ORAL | Status: AC
  Administered 2020-05-17: 10 mg via ORAL

## 2020-05-17 MED ORDER — IOPAMIDOL (ISOVUE-M 300) INJECTION 61%
10.0000 mL | Freq: Once | INTRAMUSCULAR | Status: AC
  Administered 2020-05-17: 10 mL via INTRATHECAL

## 2020-05-17 NOTE — Discharge Instructions (Signed)
Myelogram Discharge Instructions  1. Go home and rest quietly as needed. You may resume normal activities; however, do not exert yourself strongly or do any heavy lifting today and tomorrow.   2. DO NOT drive today.    3. You may resume your normal diet and medications unless otherwise indicated. Drink lots of extra fluids today and tomorrow.   4. The incidence of headache, nausea, or vomiting is about 5% (one in 20 patients).  If you develop a headache, lie flat for 24 hours and drink plenty of fluids until the headache goes away.  Caffeinated beverages may be helpful. If when you get up you still have a headache when standing, go back to bed and force fluids for another 24 hours.   5. If you develop severe nausea and vomiting or a headache that does not go away with the flat bedrest after 48 hours, please call [phone].   6. Call your physician for a follow-up appointment.  The results of your myelogram will be sent directly to your physician by the following day.  7. If you have any questions or if complications develop after you arrive home, please call [phone].  Discharge instructions have been explained to the patient.  The patient, or the person responsible for the patient, fully understands these instructions.   Thank you for visiting our office today.   YOU MAY RESUME YOUR ADDERALL TOMORROW 05/18/20 AT 1:00 PM

## 2020-05-17 NOTE — Progress Notes (Signed)
Pt reports he has been off of his Adderall for at least 48 hours.

## 2020-05-21 ENCOUNTER — Other Ambulatory Visit: Payer: Self-pay | Admitting: Family Medicine

## 2020-05-22 ENCOUNTER — Telehealth: Payer: Self-pay | Admitting: *Deleted

## 2020-05-22 NOTE — Telephone Encounter (Signed)
Drug Testosterone 20.25 MG/ACT(1.62%) gel Form [employer] Electronic PA Form [phone] [employer] Key: [id number] - PA Case ID: [id number] - Rx #: [id number] PA send today, waiting for determination

## 2020-05-23 NOTE — Telephone Encounter (Signed)
PA Rx Testosterone request has been denied

## 2020-05-23 NOTE — Telephone Encounter (Signed)
Left message to return call to our office at their convenience.  

## 2020-05-23 NOTE — Telephone Encounter (Signed)
Please let patient know. We can send in alternative if needed. Will likely have to do injections.  Clinton West. [name], MD 05/23/2020 10:35 AM

## 2020-05-24 NOTE — Telephone Encounter (Signed)
Patient notified Testosterone was denied by insurance  Stated had a new insurance. Since last visit Will give information on his next appointment

## 2020-05-24 NOTE — Telephone Encounter (Signed)
Patient calling back to speak to Stella.  

## 2020-05-30 ENCOUNTER — Other Ambulatory Visit: Payer: Self-pay

## 2020-05-30 ENCOUNTER — Ambulatory Visit: Payer: BC Managed Care – PPO | Admitting: Family Medicine

## 2020-05-30 VITALS — BP 120/77 | HR 89 | Temp 98.3°F | Ht 74.0 in | Wt 298.0 lb

## 2020-05-30 DIAGNOSIS — F909 Attention-deficit hyperactivity disorder, unspecified type: Secondary | ICD-10-CM

## 2020-05-30 DIAGNOSIS — E1165 Type 2 diabetes mellitus with hyperglycemia: Secondary | ICD-10-CM

## 2020-05-30 DIAGNOSIS — R7989 Other specified abnormal findings of blood chemistry: Secondary | ICD-10-CM | POA: Diagnosis not present

## 2020-05-30 LAB — POCT GLYCOSYLATED HEMOGLOBIN (HGB A1C): Hemoglobin A1C: 6.1 % — AB (ref 4.0–5.6)

## 2020-05-30 MED ORDER — AMPHETAMINE-DEXTROAMPHET ER 25 MG PO CP24: 25.0000 mg | ORAL_CAPSULE | Freq: Two times a day (BID) | ORAL | 0 refills | Status: DC | PRN

## 2020-05-30 NOTE — Patient Instructions (Signed)
It was very nice to see you today!  I will send in your adderall today.   No other changes today.  I will see you back in 3 months.  Come back to see me sooner if needed.   Take care, Dr [name]  PLEASE NOTE:  If you had any lab tests please let us know if you have not heard back within a few days. You may see your results on mychart before we have a chance to review them but we will give you a call once they are reviewed by [place]. If we ordered any referrals today, please let us know if you have not heard from their office within the next week.   Please try these tips to maintain a healthy lifestyle:   Eat at least 3 REAL meals and 1-2 snacks per day.  Aim for no more than 5 hours between eating.  If you eat breakfast, please do so within one hour of getting up.    Each meal should contain half fruits/vegetables, one quarter protein, and one quarter carbs (no bigger than a computer mouse)   Cut down on sweet beverages. This includes juice, soda, and sweet tea.     Drink at least 1 glass of water with each meal and aim for at least 8 glasses per day   Exercise at least 150 minutes every week.

## 2020-05-30 NOTE — Assessment & Plan Note (Signed)
Doing well with topical gel replacement.  Last testosterone and CBC are at goal.  We will need to recheck him in about 6 months.

## 2020-05-30 NOTE — Assessment & Plan Note (Signed)
A1c stable at 6.1. Discussed stopping metformin bu patient would like to keep going with it for now. Will continue metformin 500mg  daily. Recheck in 3 months.

## 2020-05-30 NOTE — Assessment & Plan Note (Addendum)
Database without redflags. Feels like medication wears off a bit early.  He is tolerating well without side effects.  Medications help with ability to stay focused and on task.  We discussed increasing dose at his last office visit.  We will send in Adderall 25 mg twice daily.  Follow-up with me in 3 months.

## 2020-05-30 NOTE — Progress Notes (Signed)
   Clinton West is a 55 y.o. male who presents today for an office visit.  Assessment/Plan:  Chronic Problems Addressed Today: Type 2 diabetes mellitus with hyperglycemia, without long-term current use of insulin (HCC) A1c stable at 6.1. Discussed stopping metformin bu patient would like to keep going with it for now. Will continue metformin 500mg  daily. Recheck in 3 months.   Attention deficit hyperactivity disorder (ADHD) Database without redflags. Feels like medication wears off a bit early.  He is tolerating well without side effects.  Medications help with ability to stay focused and on task.  We discussed increasing dose at his last office visit.  We will send in Adderall 25 mg twice daily.  Follow-up with me in 3 months.  Low testosterone Doing well with topical gel replacement.  Last testosterone and CBC are at goal.  We will need to recheck him in about 6 months.     Subjective:  HPI:  See a/p.         Objective:  Physical Exam: BP 120/77   Pulse 89   Temp 98.3 F (36.8 C) (Temporal)   Ht 6\' 2"  (1.88 m)   Wt 298 lb (135.2 kg)   SpO2 98%   BMI 38.26 kg/m   Wt Readings from Last 3 Encounters:  05/30/20 298 lb (135.2 kg)  03/08/20 290 lb 3.2 oz (131.6 kg)  03/01/20 288 lb 12.8 oz (131 kg)  Gen: No acute distress, resting comfortably Neuro: Grossly normal, moves all extremities Psych: Normal affect and thought content      Clinton West M. 05/08/20, MD 05/30/2020 10:27 AM

## 2020-05-31 ENCOUNTER — Telehealth: Payer: Self-pay | Admitting: *Deleted

## 2020-05-31 NOTE — Telephone Encounter (Signed)
KeyLucina Mellow - PA Case ID: [id number] - Rx #: [id number] Need help? Call us at [phone] Status Sent to Plan today Drug Adderall XR 25MG  er capsules Form [employer] Electronic PA Form [phone] [employer] Waiting for determination

## 2020-06-01 NOTE — Telephone Encounter (Signed)
As long as you remain covered by your prescription drug plan and there are no changes to your plan benefits, this request is approved for the following time period: 05/31/2020 - 06/01/2023  Pharmacy notified

## 2020-06-06 ENCOUNTER — Other Ambulatory Visit: Payer: Self-pay | Admitting: Family Medicine

## 2020-06-07 ENCOUNTER — Telehealth (INDEPENDENT_AMBULATORY_CARE_PROVIDER_SITE_OTHER): Payer: BC Managed Care – PPO | Admitting: Registered Nurse

## 2020-06-07 ENCOUNTER — Other Ambulatory Visit: Payer: Self-pay

## 2020-06-07 ENCOUNTER — Encounter: Payer: Self-pay | Admitting: Registered Nurse

## 2020-06-07 DIAGNOSIS — U071 COVID-19: Secondary | ICD-10-CM

## 2020-06-07 DIAGNOSIS — J22 Unspecified acute lower respiratory infection: Secondary | ICD-10-CM | POA: Diagnosis not present

## 2020-06-07 MED ORDER — AZELASTINE HCL 0.1 % NA SOLN: 1.0000 | Freq: Two times a day (BID) | NASAL | 12 refills | Status: DC

## 2020-06-07 MED ORDER — PREDNISONE 10 MG (21) PO TBPK: ORAL_TABLET | ORAL | 0 refills | Status: DC

## 2020-06-07 MED ORDER — AZITHROMYCIN 250 MG PO TABS: ORAL_TABLET | ORAL | 0 refills | Status: AC

## 2020-06-07 MED ORDER — BENZONATATE 100 MG PO CAPS: 100.0000 mg | ORAL_CAPSULE | Freq: Two times a day (BID) | ORAL | 0 refills | Status: DC | PRN

## 2020-06-07 MED ORDER — ALBUTEROL SULFATE HFA 108 (90 BASE) MCG/ACT IN AERS: 2.0000 | INHALATION_SPRAY | Freq: Four times a day (QID) | RESPIRATORY_TRACT | 0 refills | Status: DC | PRN

## 2020-06-07 MED ORDER — MOLNUPIRAVIR EUA 200MG CAPSULE: 4.0000 | ORAL_CAPSULE | Freq: Two times a day (BID) | ORAL | 0 refills | Status: AC

## 2020-06-21 ENCOUNTER — Other Ambulatory Visit: Payer: Self-pay | Admitting: Family Medicine

## 2020-06-21 MED ORDER — AMPHETAMINE-DEXTROAMPHET ER 25 MG PO CP24: 25.0000 mg | ORAL_CAPSULE | Freq: Two times a day (BID) | ORAL | 0 refills | Status: DC | PRN

## 2020-06-21 NOTE — Telephone Encounter (Signed)
Last Refill 05/30/2020 Last OV 05/30/2020 dx type 2 diabetes mellitus

## 2020-07-05 ENCOUNTER — Other Ambulatory Visit: Payer: Self-pay | Admitting: Family Medicine

## 2020-08-07 ENCOUNTER — Other Ambulatory Visit: Payer: Self-pay | Admitting: Family Medicine

## 2020-08-07 ENCOUNTER — Telehealth: Payer: Self-pay | Admitting: *Deleted

## 2020-08-07 MED ORDER — TESTOSTERONE 1.62 % TD GEL: TRANSDERMAL | 0 refills | Status: DC

## 2020-08-07 NOTE — Telephone Encounter (Signed)
PA Rx Testosterone 20.25 MG/ACT(1.62%) gel Key: [id number] - PA Case ID: [id number] - Status Sent to Plan today Waiting from determination

## 2020-08-11 NOTE — Telephone Encounter (Signed)
Send PA question  Waiting for determination

## 2020-08-15 NOTE — Progress Notes (Signed)
I, Christoper Fabian, LAT, ATC, am serving as scribe for Dr. Clementeen Graham.  Clinton West is a 55 y.o. male who presents to [facility] at [facility] today for f/u of R shoulder pain.  He was last seen by Dr. Denyse Amass on 03/08/20 and had a R subacromial steroid injection.  He was referred to PT but did not attend due to a very high co-pay amount ($100.00).  Since his last visit, pt reports that his R ant shoulder pain has returned over the last 3 weeks.  Aggravating factors con't to include R shoulder overhead AROM.  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   MSK: Right shoulder normal-appearing normal motion pain with abduction and functional internal rotation. Intact strength.    Lab and Radiology Results  Procedure: Real-time Ultrasound Guided Injection of right shoulder subacromial bursa Device: Philips Affiniti 50G Images permanently stored and available for review in PACS Verbal informed consent obtained.  Discussed risks and benefits of procedure. Warned about infection bleeding damage to structures skin hypopigmentation and fat atrophy among others. Patient expresses understanding and agreement Time-out conducted.   Noted no overlying erythema, induration, or other signs of local infection.   Skin prepped in a sterile fashion.   Local anesthesia: Topical Ethyl chloride.   With sterile technique and under real time ultrasound guidance: 40 mg of Kenalog and 2 mL of Marcaine injected into subacromial bursa. Fluid seen entering the bursa.    Completed without difficulty   Pain immediately resolved suggesting accurate placement of the medication.   Advised to call if fevers/chills, erythema, induration, drainage, or persistent bleeding.   Images permanently stored and available for review in the ultrasound unit.  Impression: Technically successful ultrasound guided injection.     EXAM: RIGHT SHOULDER - 2+ VIEW   COMPARISON:  None.   FINDINGS: There is no evidence of fracture or dislocation. Normal alignment. Mild degenerative spurring of the acromioclavicular joint. Glenohumeral joint is normal. Soft tissues are unremarkable.   IMPRESSION: Mild degenerative spurring of the acromioclavicular joint. No acute osseous abnormality.     Electronically Signed   By: M.D.   On: 03/09/2020 10:04   I, 05/09/2020, personally (independently) visualized and performed the interpretation of the images attached in this note.       Assessment and Plan: 55 y.o. male with right shoulder pain due to subacromial bursitis with potential old rotator cuff injury in the past.  Patient did great over the last 5 months with a shoulder injection and home exercise program.  The pain only started returning recently.  Plan for repeat injection today and continued home exercise program.  He has a brother-in-law who is a physical therapist that he can access to get some ongoing home exercise tune ups with if needed.  Formal physical therapy unfortunate for him is pretty expensive.  Recheck back with me as needed.  Injection today.   PDMP not reviewed this encounter. Orders Placed This Encounter  Procedures  [place] LIMITED JOINT SPACE STRUCTURES UP RIGHT(NO LINKED CHARGES)    Order Specific Question:   Reason for Exam (SYMPTOM  OR DIAGNOSIS REQUIRED)    Answer:   R shoulder pain    Order Specific Question:   Preferred imaging location?    Answer:   [facility]   No orders of the defined types were  placed in this encounter.    Discussed warning signs or symptoms. Please see discharge instructions. Patient expresses understanding.   The above documentation has been reviewed and is accurate and complete Clementeen Graham, M.D.

## 2020-08-16 ENCOUNTER — Encounter: Payer: Self-pay | Admitting: Family Medicine

## 2020-08-16 ENCOUNTER — Other Ambulatory Visit: Payer: Self-pay

## 2020-08-16 ENCOUNTER — Ambulatory Visit: Payer: Self-pay

## 2020-08-16 ENCOUNTER — Ambulatory Visit: Payer: BC Managed Care – PPO | Admitting: Family Medicine

## 2020-08-16 VITALS — BP 132/86 | HR 91 | Ht 74.0 in | Wt 298.8 lb

## 2020-08-16 DIAGNOSIS — M25511 Pain in right shoulder: Secondary | ICD-10-CM

## 2020-08-16 NOTE — Patient Instructions (Addendum)
Good to see you today.  You had a R shoulder injection.  Call or go to the ER if you develop a large red swollen joint with extreme pain or oozing puss.   Do your home exercises as described by your PT brother-in-law.  Follow-up as needed.  We can do these injections as often as every 3 months.  Continue the exercises.

## 2020-08-23 NOTE — Telephone Encounter (Signed)
Denied on August 5

## 2020-08-24 ENCOUNTER — Encounter: Payer: Self-pay | Admitting: Family Medicine

## 2020-08-25 ENCOUNTER — Other Ambulatory Visit: Payer: Self-pay

## 2020-08-25 MED ORDER — AMPHETAMINE-DEXTROAMPHET ER 25 MG PO CP24: 25.0000 mg | ORAL_CAPSULE | Freq: Two times a day (BID) | ORAL | 0 refills | Status: DC | PRN

## 2020-08-25 NOTE — Telephone Encounter (Signed)
LAST APPOINTMENT DATE:  05/30/20  NEXT APPOINTMENT DATE: None  MEDICATION:amphetamine-dextroamphetamine (ADDERALL XR) 25 MG 24 hr capsule  PHARMACY:[facility] [id number] [place], [place] - [address]

## 2020-08-28 NOTE — Telephone Encounter (Signed)
Refill sent to Dr. Parker for approval. 

## 2020-08-31 ENCOUNTER — Encounter: Payer: Self-pay | Admitting: Family Medicine

## 2020-08-31 ENCOUNTER — Ambulatory Visit: Payer: Self-pay

## 2020-08-31 ENCOUNTER — Other Ambulatory Visit: Payer: Self-pay

## 2020-08-31 ENCOUNTER — Ambulatory Visit (INDEPENDENT_AMBULATORY_CARE_PROVIDER_SITE_OTHER): Payer: BC Managed Care – PPO

## 2020-08-31 ENCOUNTER — Ambulatory Visit: Payer: BC Managed Care – PPO | Admitting: Family Medicine

## 2020-08-31 VITALS — BP 120/80 | HR 85 | Ht 74.0 in | Wt 291.2 lb

## 2020-08-31 DIAGNOSIS — Z135 Encounter for screening for eye and ear disorders: Secondary | ICD-10-CM | POA: Diagnosis not present

## 2020-08-31 DIAGNOSIS — Z578 Occupational exposure to other risk factors: Secondary | ICD-10-CM | POA: Diagnosis not present

## 2020-08-31 DIAGNOSIS — S46012A Strain of muscle(s) and tendon(s) of the rotator cuff of left shoulder, initial encounter: Secondary | ICD-10-CM

## 2020-08-31 DIAGNOSIS — M25512 Pain in left shoulder: Secondary | ICD-10-CM

## 2020-08-31 DIAGNOSIS — Z77018 Contact with and (suspected) exposure to other hazardous metals: Secondary | ICD-10-CM | POA: Diagnosis not present

## 2020-08-31 NOTE — Progress Notes (Signed)
I, Christoper Fabian, LAT, ATC, am serving as scribe for Dr. Clementeen Graham.  Clinton West is a 55 y.o. male who presents to [facility] at [place] today for L shoulder pain after trying to catch himself from falling on last Wed, Aug 17th.  He was last seen by Dr. Denyse Amass on 08/16/20 for f/u of R shoulder pain and had a R subacromial steroid injection.  Since then, pt reports having fallen on 08/23/20 and injuring his L shoulder.  He grabbed onto a handrail to try to prevent himself from falling, pulling his L shoulder and arm into extension and ER.  He locates his pain to his L ant and lateral shoulder.  Radiating pain:yes into the L lateral upper arm L shoulder mechanical symptoms: yes Aggravating factors: any attempt at L shoulder AROM beyond 45 deg Treatments tried: ice; pain-relieving sleeve w/ IcyHot; hydrocodone for his con't neck issue   Pertinent review of systems: No fevers or chills  Relevant historical information: Diabetes.  Works as a Curator.  Occasional metal fragments gets into his eyes.   Exam:  BP 120/80 (BP Location: Right Arm, Patient Position: Sitting, Cuff Size: Large)   Pulse 85   Ht 6\' 2"  (1.88 m)   Wt 291 lb 3.2 oz (132.1 kg)   SpO2 99%   BMI 37.39 kg/m  General: Well Developed, well nourished, and in no acute distress.   MSK: Left shoulder normal. Range of motion significant limitation abduction to about 70 degrees. External rotation 30 degrees below neutral position internal rotation posterior iliac crest. Strength diminished abduction 3/5.  External rotation 4/5 internal rotation 5/5.  Inadequate positioning for Hawkins or Neer's test. Grip strength pulses cap refill and sensation are intact distally.    Lab and Radiology Results  Diagnostic Limited MSK Ultrasound of: Left shoulder Biceps tendon intact Subscapularis tendon is intact. Supraspinatus tendon is not visible indicating probable tear and retraction. Infraspinatus tendon  thin versus not visible indicating tear or retraction. AC joint mild effusion Impression: Concern for significant full-thickness tear and retraction of supraspinatus and infraspinatus tendons.   X-ray images left shoulder obtained today personally and independently interpreted No acute fractures.  AC DJD. Await formal radiology review   Assessment and Plan: 55 y.o. male with left shoulder pain after a fall with a significant traction injury occurring on your shoulder.  Ultrasound and physical examination are concerning for significant rotator cuff tear of supraspinatus and infraspinatus tendons.  Because there is concern for full-thickness retracted rotator cuff tear we will proceed directly to MRI.  This is for surgical planning which will be essential for his job function. As he works in a job that is exposed to metal fragments occasional impacting his eyes we will go ahead and obtain x-ray orbits to rule out foreign body in the eye for MRI safety. If MRI shows obvious surgical problem will refer directly to orthopedic surgery.  If not recheck back in clinic after completion.   PDMP not reviewed this encounter. Orders Placed This Encounter  Procedures   58 LIMITED JOINT SPACE STRUCTURES UP LEFT(NO LINKED CHARGES)    Order Specific Question:   Reason for Exam (SYMPTOM  OR DIAGNOSIS REQUIRED)    Answer:   L shoulder pain    Order Specific Question:   Preferred imaging location?    Answer:   [facility] Shoulder Left    Standing Status:   Future    Number of Occurrences:  1    Standing Expiration Date:   10/01/2020    Order Specific Question:   Reason for Exam (SYMPTOM  OR DIAGNOSIS REQUIRED)    Answer:   L shoulder pain    Order Specific Question:   Preferred imaging location?    Answer:   [facility]   DG Eye Foreign Body    Standing Status:   Future    Number of Occurrences:   1    Standing Expiration Date:   08/31/2021    Order Specific  Question:   Reason for Exam (SYMPTOM  OR DIAGNOSIS REQUIRED)    Answer:   eval prior to MRI    Order Specific Question:   Preferred imaging location?    Answer:   [facility]   MR SHOULDER LEFT WO CONTRAST    Standing Status:   Future    Standing Expiration Date:   08/31/2021    Order Specific Question:   What is the patient's sedation requirement?    Answer:   No Sedation    Order Specific Question:   Does the patient have a pacemaker or implanted devices?    Answer:   No    Order Specific Question:   Preferred imaging location?    Answer:   [facility] (table limit-350lbs)   No orders of the defined types were placed in this encounter.    Discussed warning signs or symptoms. Please see discharge instructions. Patient expresses understanding.   The above documentation has been reviewed and is accurate and complete Clementeen Graham, M.D.

## 2020-08-31 NOTE — Patient Instructions (Addendum)
Thank you for coming in today.   Please get an Xray today before you leave   You should hear from MRI scheduling within 1 week. If you do not hear please let me know.    If the MIR shows RTC tear that is significant I will refer directly to surgery.

## 2020-09-01 NOTE — Progress Notes (Signed)
Left shoulder x-ray shows no dislocation or fracture or severe arthritis.

## 2020-09-01 NOTE — Progress Notes (Signed)
No metallic foreign body is present in the eye. Safe for MRI.

## 2020-09-09 ENCOUNTER — Other Ambulatory Visit: Payer: Self-pay

## 2020-09-09 ENCOUNTER — Ambulatory Visit (INDEPENDENT_AMBULATORY_CARE_PROVIDER_SITE_OTHER): Payer: BC Managed Care – PPO

## 2020-09-09 DIAGNOSIS — M25512 Pain in left shoulder: Secondary | ICD-10-CM

## 2020-09-09 DIAGNOSIS — S46012A Strain of muscle(s) and tendon(s) of the rotator cuff of left shoulder, initial encounter: Secondary | ICD-10-CM | POA: Diagnosis not present

## 2020-09-12 ENCOUNTER — Telehealth: Payer: Self-pay | Admitting: Family Medicine

## 2020-09-12 DIAGNOSIS — M25512 Pain in left shoulder: Secondary | ICD-10-CM

## 2020-09-12 DIAGNOSIS — S46012A Strain of muscle(s) and tendon(s) of the rotator cuff of left shoulder, initial encounter: Secondary | ICD-10-CM

## 2020-09-12 NOTE — Progress Notes (Signed)
MRI left shoulder confirms what we feared based on the ultrasound.  You have a full-thickness full width retracted tear of the supraspinatus and infraspinatus tendon.  This will require surgery to have your shoulder work normally again.  Additionally there are some labrum tears present in the shoulder that can be addressed at the same time.  I am going to refer you directly to orthopedic surgery to have a discussion about your surgical options.

## 2020-09-12 NOTE — Telephone Encounter (Signed)
Referred to orthopedics

## 2020-09-13 ENCOUNTER — Telehealth: Payer: Self-pay | Admitting: Family Medicine

## 2020-09-13 DIAGNOSIS — S46012A Strain of muscle(s) and tendon(s) of the rotator cuff of left shoulder, initial encounter: Secondary | ICD-10-CM

## 2020-09-13 DIAGNOSIS — M25512 Pain in left shoulder: Secondary | ICD-10-CM

## 2020-09-13 NOTE — Telephone Encounter (Signed)
Referral placed to Dr. Everardo Pacific at [facility].

## 2020-09-13 NOTE — Telephone Encounter (Signed)
Patient was referred to Dr. Rayburn Ma at [facility] for shoulder surgery. He has done some research and ONLY wants to see a shoulder specialist. He is cancelling this appt and would like to be referred to [facility] with a specific request to see one of their shoulder specialists.

## 2020-09-18 ENCOUNTER — Other Ambulatory Visit: Payer: BC Managed Care – PPO

## 2020-09-18 ENCOUNTER — Ambulatory Visit: Payer: BC Managed Care – PPO | Admitting: Orthopaedic Surgery

## 2020-09-20 ENCOUNTER — Other Ambulatory Visit: Payer: Self-pay | Admitting: Family Medicine

## 2020-09-20 ENCOUNTER — Encounter: Payer: Self-pay | Admitting: Family Medicine

## 2020-09-22 ENCOUNTER — Other Ambulatory Visit: Payer: Self-pay

## 2020-09-22 MED ORDER — AMPHETAMINE-DEXTROAMPHET ER 25 MG PO CP24: 25.0000 mg | ORAL_CAPSULE | Freq: Two times a day (BID) | ORAL | 0 refills | Status: DC | PRN

## 2020-10-05 ENCOUNTER — Other Ambulatory Visit: Payer: Self-pay | Admitting: Family Medicine

## 2020-10-06 MED ORDER — TESTOSTERONE 1.62 % TD GEL: TRANSDERMAL | 0 refills | Status: DC

## 2020-10-28 NOTE — Progress Notes (Signed)
Telemedicine Encounter- SOAP NOTE Established Patient  This telephone encounter was conducted with the patient's (or proxy's) verbal consent via audio telecommunications: yes/no: Yes Patient was instructed to have this encounter in a suitably private space; and to only have persons present to whom they give permission to participate. In addition, patient identity was confirmed by use of name plus two identifiers (DOB and address).  I discussed the limitations, risks, security and privacy concerns of performing an evaluation and management service by telephone and the availability of in person appointments. I also discussed with the patient that there may be a patient responsible charge related to this service. The patient expressed understanding and agreed to proceed.  I spent a total of 14 minutes talking with the patient or their proxy.  Patient at home Provider in office  Participants: Clinton Sportsman, NP and Clinton West  Chief Complaint  Patient presents with   Covid Positive    Patient states he tested positive for covid on Saturday and starting to get worse.He is experiencing some head pressure , congestion, coughing, fatigue, and little appetite.  Patient taking OTC medication with no relief.    Subjective   Clinton West is a 55 y.o. established patient. Telephone visit today for covid  HPI Symptoms onset Friday/Saturday. Tested positive on Saturday.  He has had some sinus congestion with pressure and frontal headache, some pnd with coughing, fatigue, and poor appetite/po intake. Denies shob, doe, chest pain, nvd.  Has been vaccinated x 2. No booster. Has been taking OTC cold and flu relief with limited effect.  Interested in options.  Patient Active Problem List   Diagnosis Date Noted   Erectile dysfunction 03/01/2020   BPH (benign prostatic hyperplasia) 11/01/2019   Low testosterone 06/01/2019   Attention deficit hyperactivity disorder (ADHD) 06/01/2019    Type 2 diabetes mellitus with hyperglycemia, without long-term current use of insulin (HCC) 06/01/2019   Chronic neck pain 06/01/2019   Nicotine dependence with current use 06/01/2019    Past Medical History:  Diagnosis Date   Arthritis    Depression    Diabetes mellitus without complication (HCC)    Glaucoma    Hyperlipidemia     Current Outpatient Medications  Medication Sig Dispense Refill   albuterol (VENTOLIN HFA) 108 (90 Base) MCG/ACT inhaler Inhale 2 puffs into the lungs every 6 (six) hours as needed for wheezing or shortness of breath. 8 g 0   aspirin 325 MG EC tablet Take 325 mg by mouth daily.     azelastine (ASTELIN) 0.1 % nasal spray Place 1 spray into both nostrils 2 (two) times daily. Use in each nostril as directed 30 mL 12   benzonatate (TESSALON) 100 MG capsule Take 1 capsule (100 mg total) by mouth 2 (two) times daily as needed for cough. 20 capsule 0   Coenzyme Q10-Red Yeast Rice (CO Q-10 PLUS RED YEAST RICE PO) Take by mouth.     diclofenac sodium (VOLTAREN) 1 % GEL Apply 2 g topically daily as needed (for pain).     HYDROcodone-acetaminophen (NORCO) 10-325 MG tablet Take 1 tablet by mouth every 6 (six) hours as needed.     meloxicam (MOBIC) 15 MG tablet Take 15 mg by mouth daily.     metFORMIN (GLUCOPHAGE-XR) 500 MG 24 hr tablet TAKE ONE TABLET BY MOUTH DAILY WITH SUPPER 90 tablet 3   Multiple Vitamin (MULTIVITAMIN) tablet Take 1 tablet by mouth daily.     naproxen sodium (ANAPROX) 220 MG tablet  Take 440 mg by mouth daily as needed (for pain).     Pramoxine-Menthol 1-1 % CREA Apply 1 application topically daily.     tadalafil (CIALIS) 10 MG tablet TAKE 1/2 TO 1 TABLET BY MOUTH DAILY AS NEEDED FOR ERECTILE DYSFUNCTION 30 tablet 0   amphetamine-dextroamphetamine (ADDERALL XR) 25 MG 24 hr capsule Take 1 capsule by mouth 2 (two) times daily as needed (for ADD). 60 capsule 0   Cholecalciferol (VITAMIN D3) 125 MCG (5000 UT) CAPS Take 5,000 capsules by mouth daily.      tamsulosin (FLOMAX) 0.4 MG CAPS capsule TAKE ONE CAPSULE BY MOUTH DAILY 30 capsule 3   Testosterone 1.62 % GEL PLACE 40.5MG  ONCE DAILY TO UPPER BACK AND ARMS 75 g 0   No current facility-administered medications for this visit.    Allergies  Allergen Reactions   Other Other (See Comments)    Limes gives him a headache    Social History   Socioeconomic History   Marital status: Married    Spouse name: Not on file   Number of children: Not on file   Years of education: Not on file   Highest education level: Not on file  Occupational History   Not on file  Tobacco Use   Smoking status: Every Day    Packs/day: 1.00    Types: Cigarettes   Smokeless tobacco: Never  Substance and Sexual Activity   Alcohol use: Yes    Comment: socially   Drug use: No   Sexual activity: Not on file  Other Topics Concern   Not on file  Social History Narrative   Not on file   Social Determinants of Health   Financial Resource Strain: Not on file  Food Insecurity: Not on file  Transportation Needs: Not on file  Physical Activity: Not on file  Stress: Not on file  Social Connections: Not on file  Intimate Partner Violence: Not on file    ROS Per hpi   Objective   Vitals as reported by the patient: There were no vitals filed for this visit.  Clinton West was seen today for covid positive.  Diagnoses and all orders for this visit:  COVID-19 -     molnupiravir EUA 200 mg CAPS; Take 4 capsules (800 mg total) by mouth 2 (two) times daily for 5 days. -     benzonatate (TESSALON) 100 MG capsule; Take 1 capsule (100 mg total) by mouth 2 (two) times daily as needed for cough. -     azelastine (ASTELIN) 0.1 % nasal spray; Place 1 spray into both nostrils 2 (two) times daily. Use in each nostril as directed -     albuterol (VENTOLIN HFA) 108 (90 Base) MCG/ACT inhaler; Inhale 2 puffs into the lungs every 6 (six) hours as needed for wheezing or shortness of breath.  Lower respiratory infection -      azithromycin (ZITHROMAX) 250 MG tablet; Take 2 tablets on day 1, then 1 tablet daily on days 2 through 5 -     Discontinue: predniSONE (STERAPRED UNI-PAK 21 TAB) 10 MG (21) TBPK tablet; Take per package instructions. Do not skip doses. Finish entire supply.   PLAN Reviewed options with patient. Agree to pursue molnupiravir as above. Tessalon, azelastine, albuterol for relief. Given concern for developing lower respiratory infections and his history, will give z pack and prednisone should symptoms persist after antivirals Reviewed ER and isolation precautions with pt who voices understanding. Discussed risks, benefits, and alternatives to treatment plan with patient who  voices understanding Patient encouraged to call clinic with any questions, comments, or concerns.   I discussed the assessment and treatment plan with the patient. The patient was provided an opportunity to ask questions and all were answered. The patient agreed with the plan and demonstrated an understanding of the instructions.   The patient was advised to call back or seek an in-person evaluation if the symptoms worsen or if the condition fails to improve as anticipated.  I provided 14 minutes of non-face-to-face time during this encounter.  Janeece Agee, NP

## 2020-11-24 ENCOUNTER — Telehealth: Payer: Self-pay

## 2020-11-24 NOTE — Telephone Encounter (Signed)
LAST APPOINTMENT DATE:  05/30/20  NEXT APPOINTMENT DATE: none  MEDICATION:amphetamine-dextroamphetamine (ADDERALL XR) 25 MG 24 hr capsule  PHARMACY: [facility] [id number] - [place], [place] - [address]

## 2020-11-27 MED ORDER — AMPHETAMINE-DEXTROAMPHET ER 25 MG PO CP24: 25.0000 mg | ORAL_CAPSULE | Freq: Two times a day (BID) | ORAL | 0 refills | Status: DC | PRN

## 2020-11-27 NOTE — Telephone Encounter (Signed)
Patient notified and verbalized understanding. 

## 2020-12-26 ENCOUNTER — Telehealth: Payer: Self-pay

## 2020-12-26 MED ORDER — AMPHETAMINE-DEXTROAMPHET ER 25 MG PO CP24: 25.0000 mg | ORAL_CAPSULE | Freq: Two times a day (BID) | ORAL | 0 refills | Status: DC | PRN

## 2020-12-26 NOTE — Telephone Encounter (Signed)
Called pt and left detailed vm that refill had been sent in and he would need to schedule an OV for further refills.

## 2020-12-26 NOTE — Telephone Encounter (Signed)
Please see note.

## 2020-12-26 NOTE — Telephone Encounter (Signed)
Need his Adderall refilled. Will be out tomorrow.

## 2021-01-07 HISTORY — PX: SHOULDER ARTHROSCOPY: SHX128

## 2021-01-18 ENCOUNTER — Other Ambulatory Visit: Payer: Self-pay | Admitting: Family Medicine

## 2021-01-18 NOTE — Telephone Encounter (Signed)
Needs office visit.

## 2021-02-01 ENCOUNTER — Other Ambulatory Visit: Payer: Self-pay | Admitting: Family Medicine

## 2021-02-02 ENCOUNTER — Telehealth: Payer: Self-pay | Admitting: Family Medicine

## 2021-02-02 NOTE — Telephone Encounter (Signed)
Patient needs a refill of Adderall. Pt is completely out of medication.

## 2021-02-02 NOTE — Telephone Encounter (Signed)
Sent pt msg to schedule an appt to get refills

## 2021-02-08 ENCOUNTER — Encounter: Payer: Self-pay | Admitting: Family Medicine

## 2021-02-08 ENCOUNTER — Ambulatory Visit: Payer: BC Managed Care – PPO | Admitting: Family Medicine

## 2021-02-08 ENCOUNTER — Other Ambulatory Visit: Payer: Self-pay

## 2021-02-08 VITALS — BP 126/88 | HR 90 | Temp 98.1°F | Ht 74.0 in | Wt 297.4 lb

## 2021-02-08 DIAGNOSIS — R7989 Other specified abnormal findings of blood chemistry: Secondary | ICD-10-CM

## 2021-02-08 DIAGNOSIS — E1165 Type 2 diabetes mellitus with hyperglycemia: Secondary | ICD-10-CM

## 2021-02-08 DIAGNOSIS — Z13220 Encounter for screening for lipoid disorders: Secondary | ICD-10-CM

## 2021-02-08 DIAGNOSIS — F909 Attention-deficit hyperactivity disorder, unspecified type: Secondary | ICD-10-CM

## 2021-02-08 DIAGNOSIS — Z125 Encounter for screening for malignant neoplasm of prostate: Secondary | ICD-10-CM | POA: Diagnosis not present

## 2021-02-08 LAB — COMPREHENSIVE METABOLIC PANEL
ALT: 23 U/L (ref 0–53)
AST: 17 U/L (ref 0–37)
Albumin: 4.4 g/dL (ref 3.5–5.2)
Alkaline Phosphatase: 65 U/L (ref 39–117)
BUN: 19 mg/dL (ref 6–23)
CO2: 31 mEq/L (ref 19–32)
Calcium: 9.6 mg/dL (ref 8.4–10.5)
Chloride: 102 mEq/L (ref 96–112)
Creatinine, Ser: 0.7 mg/dL (ref 0.40–1.50)
GFR: 102.31 mL/min (ref >60.00)
Glucose, Bld: 100 mg/dL — ABNORMAL HIGH (ref 70–99)
Potassium: 4.5 mEq/L (ref 3.5–5.1)
Sodium: 139 mEq/L (ref 135–145)
Total Bilirubin: 0.6 mg/dL (ref 0.2–1.2)
Total Protein: 7.4 g/dL (ref 6.0–8.3)

## 2021-02-08 LAB — MICROALBUMIN / CREATININE URINE RATIO
Creatinine,U: 58.3 mg/dL
Microalb Creat Ratio: 1.2 mg/g (ref 0.0–30.0)
Microalb, Ur: <0.7 mg/dL (ref 0.0–1.9)

## 2021-02-08 LAB — CBC
HCT: 48.4 % (ref 39.0–52.0)
Hemoglobin: 16 g/dL (ref 13.0–17.0)
MCHC: 33.1 g/dL (ref 30.0–36.0)
MCV: 93.7 fl (ref 78.0–100.0)
Platelets: 220 10*3/uL (ref 150.0–400.0)
RBC: 5.16 Mil/uL (ref 4.22–5.81)
RDW: 13.9 % (ref 11.5–15.5)
WBC: 7.7 10*3/uL (ref 4.0–10.5)

## 2021-02-08 LAB — TESTOSTERONE: Testosterone: 289.52 ng/dL — ABNORMAL LOW (ref 300.00–890.00)

## 2021-02-08 LAB — LIPID PANEL
Cholesterol: 163 mg/dL (ref 0–200)
HDL: 36.9 mg/dL — ABNORMAL LOW (ref >39.00)
LDL Cholesterol: 94 mg/dL (ref 0–99)
NonHDL: 126.06
Total CHOL/HDL Ratio: 4
Triglycerides: 158 mg/dL — ABNORMAL HIGH (ref 0.0–149.0)
VLDL: 31.6 mg/dL (ref 0.0–40.0)

## 2021-02-08 LAB — PSA: PSA: 0.54 ng/mL (ref 0.10–4.00)

## 2021-02-08 LAB — POCT GLYCOSYLATED HEMOGLOBIN (HGB A1C): Hemoglobin A1C: 6.5 % — AB (ref 4.0–5.6)

## 2021-02-08 MED ORDER — AMPHETAMINE-DEXTROAMPHET ER 25 MG PO CP24: 25.0000 mg | ORAL_CAPSULE | Freq: Two times a day (BID) | ORAL | 0 refills | Status: DC | PRN

## 2021-02-08 NOTE — Assessment & Plan Note (Signed)
A1c goal 6.5.  Continue metformin 500 mg daily.  Discussed lifestyle modifications.  He is working on cutting down on sugar.

## 2021-02-08 NOTE — Patient Instructions (Signed)
It was very nice to see you today!  I will refill your medications today.  Your A1c is stable at 6.5.  Please continue to work on the diet and exercise.  We will check blood work today.  I will see back in 6 months.  Come back sooner if needed.  Take care, Dr [name]  PLEASE NOTE:  If you had any lab tests please let us know if you have not heard back within a few days. You may see your results on mychart before we have a chance to review them but we will give you a call once they are reviewed by [place]. If we ordered any referrals today, please let us know if you have not heard from their office within the next week.   Please try these tips to maintain a healthy lifestyle:  Eat at least 3 REAL meals and 1-2 snacks per day.  Aim for no more than 5 hours between eating.  If you eat breakfast, please do so within one hour of getting up.   Each meal should contain half fruits/vegetables, one quarter protein, and one quarter carbs (no bigger than a computer mouse)  Cut down on sweet beverages. This includes juice, soda, and sweet tea.   Drink at least 1 glass of water with each meal and aim for at least 8 glasses per day  Exercise at least 150 minutes every week.

## 2021-02-08 NOTE — Assessment & Plan Note (Signed)
Database no red flags.  Doing well on Adderall 25 mg twice daily.  No significant side effects.  Will refill today.  Follow-up in 6 months.

## 2021-02-08 NOTE — Progress Notes (Signed)
° °  Clinton West is a 56 y.o. male who presents today for an office visit.  Assessment/Plan:  New/Acute Problems: Shoulder Pain Continue management per orthopedics.   Chronic Problems Addressed Today: Type 2 diabetes mellitus with hyperglycemia, without long-term current use of insulin (HCC) A1c goal 6.5.  Continue metformin 500 mg daily.  Discussed lifestyle modifications.  He is working on cutting down on sugar.  Attention deficit hyperactivity disorder (ADHD) Database no red flags.  Doing well on Adderall 25 mg twice daily.  No significant side effects.  Will refill today.  Follow-up in 6 months.  Low testosterone On gel replacement.  We will check labs today.  He feels like the dose could be a little bit stronger that we will check labs for adjusting dose.     Subjective:  HPI:  Patient here for follow up. He was last seen in the office on 05/30/2020.   He injured his shoulder about 6 months ago. Had pain since then. He notes he injured his shoulder when trying to catch himself from fall on stairs. Had work up and was found to have rotator cuff tear. Pain is improved about 75% for the past few months. . He is following with orthopedics.   He is on Metformin 500 mg daily for diabetes mellitus. His A1c was 6.5 in office today. He is tolerating his medication well. No side effects. Denies fatigue or fever.   He is currently on Adderall 25 mg twice daily for ADHD. He notes this has helped. He is tolerating his medication without any side effects. No palpitation or chest pain.        Objective:  Physical Exam: BP 126/88 (BP Location: Right Arm)    Pulse 90    Temp 98.1 F (36.7 C) (Temporal)    Ht 6\' 2"  (1.88 m)    Wt 297 lb 6.4 oz (134.9 kg)    SpO2 97%    BMI 38.18 kg/m   Gen: No acute distress, resting comfortably CV: Regular rate and rhythm with no murmurs appreciated Pulm: Normal work of breathing, clear to auscultation bilaterally with no crackles, wheezes, or  rhonchi Neuro: Grossly normal, moves all extremities Psych: Normal affect and thought content       I,Savera Zaman,acting as a scribe for , MD.,have documented all relevant documentation on the behalf of Jacquiline Doe, MD,as directed by  Jacquiline Doe, MD while in the presence of Jacquiline Doe, MD.   I, Jacquiline Doe, MD, have reviewed all documentation for this visit. The documentation on 02/08/21 for the exam, diagnosis, procedures, and orders are all accurate and complete.  04/08/21. Katina Degree, MD 02/08/2021 10:38 AM

## 2021-02-08 NOTE — Assessment & Plan Note (Signed)
On gel replacement.  We will check labs today.  He feels like the dose could be a little bit stronger that we will check labs for adjusting dose.

## 2021-02-15 ENCOUNTER — Other Ambulatory Visit: Payer: Self-pay | Admitting: Family Medicine

## 2021-02-16 NOTE — Progress Notes (Signed)
Please inform patient of the following:  His testosterone is low. We can try increasing his daily dose to 3 pumps. I would like for him to come back and recheck in 3 months after increasing the dose.   The rest of his labs are all stable. His "good" cholesterol is low but better than the past. He should keep working on diet and exercise and we can recheck this in a year.  Katina Degree. [name], MD 02/16/2021 7:57 AM

## 2021-03-12 ENCOUNTER — Other Ambulatory Visit: Payer: Self-pay | Admitting: *Deleted

## 2021-03-12 NOTE — Telephone Encounter (Signed)
Patient call requesting refills  ?Rx Adderall and Rx Testosterone gel ?

## 2021-03-13 MED ORDER — AMPHETAMINE-DEXTROAMPHET ER 25 MG PO CP24: 25.0000 mg | ORAL_CAPSULE | Freq: Two times a day (BID) | ORAL | 0 refills | Status: DC | PRN

## 2021-03-13 MED ORDER — TESTOSTERONE 20.25 MG/ACT (1.62%) TD GEL: TRANSDERMAL | 1 refills | Status: DC

## 2021-03-15 ENCOUNTER — Telehealth: Payer: Self-pay | Admitting: *Deleted

## 2021-03-15 NOTE — Telephone Encounter (Signed)
Deniedtoday °Your PA request has been denied.  °

## 2021-03-15 NOTE — Telephone Encounter (Signed)
Key: [id number] - PA Case ID: [id number] ?Status ?Sent to Plan today ?Drug ?Testosterone 1.62% gel ?Waiting for determination  ?

## 2021-04-18 ENCOUNTER — Other Ambulatory Visit: Payer: Self-pay | Admitting: Family Medicine

## 2021-04-18 ENCOUNTER — Encounter: Payer: Self-pay | Admitting: Family Medicine

## 2021-04-18 NOTE — Telephone Encounter (Signed)
Please advise 

## 2021-04-23 MED ORDER — AMPHETAMINE-DEXTROAMPHET ER 25 MG PO CP24: 25.0000 mg | ORAL_CAPSULE | Freq: Two times a day (BID) | ORAL | 0 refills | Status: DC | PRN

## 2021-04-23 MED ORDER — TESTOSTERONE 20.25 MG/ACT (1.62%) TD GEL
TRANSDERMAL | 1 refills | Status: DC
Start: 2021-04-23 — End: 2021-07-05

## 2021-04-24 ENCOUNTER — Other Ambulatory Visit: Payer: Self-pay | Admitting: *Deleted

## 2021-04-24 ENCOUNTER — Telehealth: Payer: Self-pay | Admitting: *Deleted

## 2021-04-24 NOTE — Telephone Encounter (Signed)
Pharmacy requesting For 90 day supply  ?

## 2021-04-24 NOTE — Telephone Encounter (Addendum)
Rx Testosterone  PA request has been denied ?

## 2021-04-24 NOTE — Telephone Encounter (Signed)
Key: [id number] - PA Case ID: [id number] - Rx #: [id number] ?Status ?Sent to Plantoday ?Drug ?Testosterone 20.25 MG/ACT(1.62%) gel ?Waiting for determination  ?

## 2021-04-25 MED ORDER — AMPHETAMINE-DEXTROAMPHET ER 25 MG PO CP24: 25.0000 mg | ORAL_CAPSULE | Freq: Two times a day (BID) | ORAL | 0 refills | Status: DC | PRN

## 2021-05-04 ENCOUNTER — Encounter: Payer: Self-pay | Admitting: Family Medicine

## 2021-05-04 ENCOUNTER — Other Ambulatory Visit: Payer: Self-pay

## 2021-05-04 MED ORDER — METFORMIN HCL ER 500 MG PO TB24: ORAL_TABLET | ORAL | 3 refills | Status: DC

## 2021-05-21 ENCOUNTER — Other Ambulatory Visit: Payer: Self-pay | Admitting: Family Medicine

## 2021-05-21 LAB — HM DIABETES EYE EXAM

## 2021-05-21 MED ORDER — AMPHETAMINE-DEXTROAMPHET ER 25 MG PO CP24: 25.0000 mg | ORAL_CAPSULE | Freq: Two times a day (BID) | ORAL | 0 refills | Status: DC | PRN

## 2021-05-22 ENCOUNTER — Encounter: Payer: Self-pay | Admitting: Family Medicine

## 2021-05-29 ENCOUNTER — Encounter: Payer: Self-pay | Admitting: Family Medicine

## 2021-05-29 NOTE — Telephone Encounter (Signed)
Left message to return call to our office at their convenience.  

## 2021-05-29 NOTE — Telephone Encounter (Signed)
Pt states he does not want to use [facility] and instead would like to stay with the [facility] pharmacy on [address]  That pharmacy states they have the instant release and not XR. Pt states he is willing to use the immediate release instead to get back on the medication.  Pt has been out of medication for four days.   Please send to [facility] [id number] - [place], [place] - [address]  [address] [place], [place] [address]  Phone:  [phone]  Fax:  [phone]

## 2021-05-29 NOTE — Telephone Encounter (Signed)
See note

## 2021-05-30 NOTE — Telephone Encounter (Signed)
Spoke with patient see previews note  

## 2021-05-30 NOTE — Telephone Encounter (Signed)
Can we clarify with patient? The immediate release does not come in 25mg . IT is either 20 or 30. We can also do 12.5mg  twice daily which would be closer to his XR 25mg  daily.  . , MD 05/30/2021 11:11 AM

## 2021-05-30 NOTE — Telephone Encounter (Signed)
Patient present in our clinic stated pharmacy out of 25mg   Stated pharmacy has Adderral 20mg ,10 mg 15mg  Patient taking 25mg  bid  It is ok to send Rx 10mg  15mg  bid? Please advise

## 2021-05-31 MED ORDER — AMPHETAMINE-DEXTROAMPHETAMINE 15 MG PO TABS: 15.0000 mg | ORAL_TABLET | Freq: Two times a day (BID) | ORAL | 0 refills | Status: DC

## 2021-05-31 MED ORDER — AMPHETAMINE-DEXTROAMPHETAMINE 10 MG PO TABS: 10.0000 mg | ORAL_TABLET | Freq: Two times a day (BID) | ORAL | 0 refills | Status: DC

## 2021-05-31 NOTE — Telephone Encounter (Signed)
Rx was send in  

## 2021-06-21 ENCOUNTER — Other Ambulatory Visit: Payer: Self-pay | Admitting: Family Medicine

## 2021-06-23 ENCOUNTER — Other Ambulatory Visit: Payer: Self-pay | Admitting: Family Medicine

## 2021-06-23 MED ORDER — AMPHETAMINE-DEXTROAMPHETAMINE 15 MG PO TABS: 15.0000 mg | ORAL_TABLET | Freq: Two times a day (BID) | ORAL | 0 refills | Status: DC

## 2021-06-23 MED ORDER — AMPHETAMINE-DEXTROAMPHETAMINE 10 MG PO TABS: 10.0000 mg | ORAL_TABLET | Freq: Two times a day (BID) | ORAL | 0 refills | Status: DC

## 2021-06-25 ENCOUNTER — Telehealth: Payer: Self-pay | Admitting: *Deleted

## 2021-06-25 NOTE — Telephone Encounter (Signed)
Patient call requesting Testosterone gel refill   LAST APPOINTMENT DATE: 02/08/2021   NEXT APPOINTMENT DATE: 08/08/2021    LAST REFILL: 04/23/2021  QTY:150 g

## 2021-06-27 ENCOUNTER — Telehealth: Payer: Self-pay | Admitting: *Deleted

## 2021-06-27 ENCOUNTER — Other Ambulatory Visit: Payer: Self-pay | Admitting: *Deleted

## 2021-06-27 DIAGNOSIS — R7989 Other specified abnormal findings of blood chemistry: Secondary | ICD-10-CM

## 2021-06-27 NOTE — Telephone Encounter (Signed)
Received response from insurance for Testosterone: Denied today Your PA request has been denied. Additional information will be provided in the denial communication. You do not meet the requirements of your plan. Your plan covers this drug when you do not have age-related hypogonadism.

## 2021-06-27 NOTE — Telephone Encounter (Signed)
Left message to return call to our office at their convenience.   Patient need lab appointment to recheck Testosterone level  Order placed

## 2021-06-27 NOTE — Telephone Encounter (Signed)
Left message on voicemail to call office.  

## 2021-06-27 NOTE — Telephone Encounter (Signed)
Key: [id number] - Rx #: [id number] Status Sent to Plan today 06/27/2021 Drug Testosterone 20.25 MG/ACT(1.62%) gel Waiting for determination

## 2021-06-28 ENCOUNTER — Other Ambulatory Visit (INDEPENDENT_AMBULATORY_CARE_PROVIDER_SITE_OTHER): Payer: BC Managed Care – PPO

## 2021-06-28 DIAGNOSIS — R7989 Other specified abnormal findings of blood chemistry: Secondary | ICD-10-CM | POA: Diagnosis not present

## 2021-06-28 LAB — TESTOSTERONE: Testosterone: 236.11 ng/dL — ABNORMAL LOW (ref 300.00–890.00)

## 2021-07-05 MED ORDER — TESTOSTERONE 20.25 MG/ACT (1.62%) TD GEL: TRANSDERMAL | 1 refills | Status: DC

## 2021-07-05 NOTE — Addendum Note (Signed)
Addended by: Ardith Dark on: 07/05/2021 10:31 AM   Modules accepted: Orders

## 2021-08-08 ENCOUNTER — Ambulatory Visit: Payer: BC Managed Care – PPO | Admitting: Family Medicine

## 2021-08-08 VITALS — BP 112/77 | HR 86 | Temp 98.0°F | Ht 74.0 in | Wt 297.0 lb

## 2021-08-08 DIAGNOSIS — Z13220 Encounter for screening for lipoid disorders: Secondary | ICD-10-CM | POA: Diagnosis not present

## 2021-08-08 DIAGNOSIS — R42 Dizziness and giddiness: Secondary | ICD-10-CM

## 2021-08-08 DIAGNOSIS — R7989 Other specified abnormal findings of blood chemistry: Secondary | ICD-10-CM

## 2021-08-08 DIAGNOSIS — F909 Attention-deficit hyperactivity disorder, unspecified type: Secondary | ICD-10-CM | POA: Diagnosis not present

## 2021-08-08 DIAGNOSIS — N400 Enlarged prostate without lower urinary tract symptoms: Secondary | ICD-10-CM

## 2021-08-08 DIAGNOSIS — N529 Male erectile dysfunction, unspecified: Secondary | ICD-10-CM

## 2021-08-08 DIAGNOSIS — E1165 Type 2 diabetes mellitus with hyperglycemia: Secondary | ICD-10-CM

## 2021-08-08 LAB — URINALYSIS, ROUTINE W REFLEX MICROSCOPIC
Bilirubin Urine: NEGATIVE
Ketones, ur: NEGATIVE
Leukocytes,Ua: NEGATIVE
Nitrite: NEGATIVE
Specific Gravity, Urine: 1.02 (ref 1.000–1.030)
Total Protein, Urine: NEGATIVE
Urine Glucose: NEGATIVE
Urobilinogen, UA: 0.2 (ref 0.0–1.0)
pH: 6 (ref 5.0–8.0)

## 2021-08-08 LAB — COMPREHENSIVE METABOLIC PANEL
ALT: 21 U/L (ref 0–53)
AST: 16 U/L (ref 0–37)
Albumin: 4.2 g/dL (ref 3.5–5.2)
Alkaline Phosphatase: 66 U/L (ref 39–117)
BUN: 18 mg/dL (ref 6–23)
CO2: 26 mEq/L (ref 19–32)
Calcium: 9.2 mg/dL (ref 8.4–10.5)
Chloride: 100 mEq/L (ref 96–112)
Creatinine, Ser: 0.8 mg/dL (ref 0.40–1.50)
GFR: 97.92 mL/min (ref >60.00)
Glucose, Bld: 122 mg/dL — ABNORMAL HIGH (ref 70–99)
Potassium: 4.2 mEq/L (ref 3.5–5.1)
Sodium: 141 mEq/L (ref 135–145)
Total Bilirubin: 0.3 mg/dL (ref 0.2–1.2)
Total Protein: 7.1 g/dL (ref 6.0–8.3)

## 2021-08-08 LAB — CBC
HCT: 47.7 % (ref 39.0–52.0)
Hemoglobin: 15.9 g/dL (ref 13.0–17.0)
MCHC: 33.3 g/dL (ref 30.0–36.0)
MCV: 94.3 fl (ref 78.0–100.0)
Platelets: 200 10*3/uL (ref 150.0–400.0)
RBC: 5.05 Mil/uL (ref 4.22–5.81)
RDW: 14.3 % (ref 11.5–15.5)
WBC: 10.1 10*3/uL (ref 4.0–10.5)

## 2021-08-08 LAB — LDL CHOLESTEROL, DIRECT: Direct LDL: 106 mg/dL

## 2021-08-08 LAB — POCT GLYCOSYLATED HEMOGLOBIN (HGB A1C): Hemoglobin A1C: 6.9 % — AB (ref 4.0–5.6)

## 2021-08-08 LAB — LIPID PANEL
Cholesterol: 173 mg/dL (ref 0–200)
HDL: 30 mg/dL — ABNORMAL LOW (ref >39.00)
Total CHOL/HDL Ratio: 6
Triglycerides: 423 mg/dL — ABNORMAL HIGH (ref 0.0–149.0)

## 2021-08-08 LAB — TESTOSTERONE: Testosterone: 220.02 ng/dL — ABNORMAL LOW (ref 300.00–890.00)

## 2021-08-08 LAB — TSH: TSH: 1.64 u[IU]/mL (ref 0.35–5.50)

## 2021-08-08 MED ORDER — AMPHETAMINE-DEXTROAMPHETAMINE 10 MG PO TABS: 10.0000 mg | ORAL_TABLET | Freq: Two times a day (BID) | ORAL | 0 refills | Status: DC

## 2021-08-08 MED ORDER — AMPHETAMINE-DEXTROAMPHETAMINE 15 MG PO TABS
15.0000 mg | ORAL_TABLET | Freq: Two times a day (BID) | ORAL | 0 refills | Status: DC
Start: 2021-08-08 — End: 2021-09-27

## 2021-08-08 MED ORDER — TESTOSTERONE 20.25 MG/ACT (1.62%) TD GEL: TRANSDERMAL | 1 refills | Status: DC

## 2021-08-08 NOTE — Progress Notes (Signed)
   Clinton West is a 56 y.o. male who presents today for an office visit.  Assessment/Plan:  New/Acute Problems: Dizziness No red flags. Reassuring exam today.  No other red flag symptoms.  Possibly due to dehydration but may be a side effect of flomax.  He is very active at work and exertion does not precipitate symptoms-doubt this represents a cardiac or pulmonary issue.  We will check labs today.  We discussed stopping his Flomax however he deferred for now.  Encouraged him to continue to stay well-hydrated.  We discussed reasons to return to care.  Chronic Problems Addressed Today: Type 2 diabetes mellitus with hyperglycemia, without long-term current use of insulin (HCC) A1c at goal on 6.9. He is on metformin 500mg  daily. We can recheck in 6 months.   Attention deficit hyperactivity disorder (ADHD) Database with no red flags. He is doing well on adderall IR 25mg  total twice daily. No significant side effects. Will refill today. Follow up in 6 months.   Low testosterone Currently on 4 pumps of gel daily. Last testosterone was not at goal before we increased to 4 pumps.Will recheck testosterone today. IF still not at goal would consider referral to endocrinology or urology.   Erectile dysfunction He has not been taking Cialis routinely-doubt this is contributing to his dizziness.  BPH (benign prostatic hyperplasia) On Flomax which works well though could be contributing some to his dizziness.  He does not wish to stop this medication today as it has been very effective to help managing his urinary symptoms.     Subjective:  HPI:  See A/p for status of chronic conditions.  He has been feeling a little unsteady the last several weeks. This usually occurs when he is going from sitting to standing or after he stands up. No loss of consciousness. No chest pain or shortness of breath. No cramping. No room spinning sesation.   No weakness or numbness.  Symptoms are not exertional and  he is able to do his normal job activities without exacerbation of symptoms.       Objective:  Physical Exam: BP 112/77   Pulse 86   Temp 98 F (36.7 C) (Temporal)   Ht 6\' 2"  (1.88 m)   Wt 297 lb (134.7 kg)   SpO2 97%   BMI 38.13 kg/m   Wt Readings from Last 3 Encounters:  08/08/21 297 lb (134.7 kg)  02/08/21 297 lb 6.4 oz (134.9 kg)  08/31/20 291 lb 3.2 oz (132.1 kg)    Gen: No acute distress, resting comfortably CV: Regular rate and rhythm with no murmurs appreciated Pulm: Normal work of breathing, clear to auscultation bilaterally with no crackles, wheezes, or rhonchi Neuro: Grossly normal, moves all extremities.  Cranial nerves II through XII intact.  Finger-nose-finger testing intact bilaterally.  Strength in all extremities. Psych: Normal affect and thought content      Clinton West M. 10/08/21, MD 08/08/2021 10:57 AM

## 2021-08-08 NOTE — Assessment & Plan Note (Signed)
On Flomax which works well though could be contributing some to his dizziness.  He does not wish to stop this medication today as it has been very effective to help managing his urinary symptoms.

## 2021-08-08 NOTE — Assessment & Plan Note (Signed)
A1c at goal on 6.9. He is on metformin 500mg  daily. We can recheck in 6 months.

## 2021-08-08 NOTE — Assessment & Plan Note (Signed)
Database with no red flags. He is doing well on adderall IR 25mg  total twice daily. No significant side effects. Will refill today. Follow up in 6 months.

## 2021-08-08 NOTE — Assessment & Plan Note (Signed)
He has not been taking Cialis routinely-doubt this is contributing to his dizziness.

## 2021-08-08 NOTE — Assessment & Plan Note (Signed)
Currently on 4 pumps of gel daily. Last testosterone was not at goal before we increased to 4 pumps.Will recheck testosterone today. IF still not at goal would consider referral to endocrinology or urology.

## 2021-08-08 NOTE — Patient Instructions (Addendum)
It was very nice to see you today!  We will check blood work today.   I will refill your medications.  Can back to see me in 6 months.  Come back sooner if needed.  Take care, Dr [name]  PLEASE NOTE:  If you had any lab tests please let us know if you have not heard back within a few days. You may see your results on mychart before we have a chance to review them but we will give you a call once they are reviewed by [place]. If we ordered any referrals today, please let us know if you have not heard from their office within the next week.   Please try these tips to maintain a healthy lifestyle:  Eat at least 3 REAL meals and 1-2 snacks per day.  Aim for no more than 5 hours between eating.  If you eat breakfast, please do so within one hour of getting up.   Each meal should contain half fruits/vegetables, one quarter protein, and one quarter carbs (no bigger than a computer mouse)  Cut down on sweet beverages. This includes juice, soda, and sweet tea.   Drink at least 1 glass of water with each meal and aim for at least 8 glasses per day  Exercise at least 150 minutes every week.

## 2021-08-10 NOTE — Progress Notes (Signed)
Please inform patient of the following:  His testosterone is still low.  He also had a little blood in his urine.  Recommend we refer him to urology to discuss both of these things they can discuss testosterone management as well as rechecking the blood in his urine.  The rest of his labs are all stable and we can recheck in a year.

## 2021-08-11 ENCOUNTER — Other Ambulatory Visit: Payer: Self-pay | Admitting: *Deleted

## 2021-08-11 DIAGNOSIS — R319 Hematuria, unspecified: Secondary | ICD-10-CM

## 2021-08-11 DIAGNOSIS — R7989 Other specified abnormal findings of blood chemistry: Secondary | ICD-10-CM

## 2021-08-26 ENCOUNTER — Other Ambulatory Visit: Payer: Self-pay

## 2021-08-26 ENCOUNTER — Emergency Department (HOSPITAL_COMMUNITY): Payer: BC Managed Care – PPO

## 2021-08-26 ENCOUNTER — Emergency Department (HOSPITAL_COMMUNITY)
Admission: EM | Admit: 2021-08-26 | Discharge: 2021-08-26 | Discharge status: Against Medical Advice | Payer: BC Managed Care – PPO | Attending: Emergency Medicine | Admitting: Emergency Medicine

## 2021-08-26 ENCOUNTER — Encounter (HOSPITAL_COMMUNITY): Payer: Self-pay | Admitting: Emergency Medicine

## 2021-08-26 DIAGNOSIS — R0781 Pleurodynia: Secondary | ICD-10-CM | POA: Insufficient documentation

## 2021-08-26 DIAGNOSIS — R059 Cough, unspecified: Secondary | ICD-10-CM | POA: Insufficient documentation

## 2021-08-26 DIAGNOSIS — R0602 Shortness of breath: Secondary | ICD-10-CM | POA: Insufficient documentation

## 2021-08-26 DIAGNOSIS — Z5321 Procedure and treatment not carried out due to patient leaving prior to being seen by health care provider: Secondary | ICD-10-CM | POA: Diagnosis not present

## 2021-08-26 LAB — COMPREHENSIVE METABOLIC PANEL
ALT: 25 U/L (ref 0–44)
AST: 19 U/L (ref 15–41)
Albumin: 3.8 g/dL (ref 3.5–5.0)
Alkaline Phosphatase: 59 U/L (ref 38–126)
Anion gap: 9 (ref 5–15)
BUN: 9 mg/dL (ref 6–20)
CO2: 24 mmol/L (ref 22–32)
Calcium: 8.8 mg/dL — ABNORMAL LOW (ref 8.9–10.3)
Chloride: 102 mmol/L (ref 98–111)
Creatinine, Ser: 0.73 mg/dL (ref 0.61–1.24)
GFR, Estimated: >60 mL/min (ref >60)
Glucose, Bld: 134 mg/dL — ABNORMAL HIGH (ref 70–99)
Potassium: 3.9 mmol/L (ref 3.5–5.1)
Sodium: 135 mmol/L (ref 135–145)
Total Bilirubin: 0.7 mg/dL (ref 0.3–1.2)
Total Protein: 6.6 g/dL (ref 6.5–8.1)

## 2021-08-26 LAB — CBC WITH DIFFERENTIAL/PLATELET
Abs Immature Granulocytes: 0.02 10*3/uL (ref 0.00–0.07)
Basophils Absolute: 0.1 10*3/uL (ref 0.0–0.1)
Basophils Relative: 1 %
Eosinophils Absolute: 0.2 10*3/uL (ref 0.0–0.5)
Eosinophils Relative: 2 %
HCT: 47.6 % (ref 39.0–52.0)
Hemoglobin: 16 g/dL (ref 13.0–17.0)
Immature Granulocytes: 0 %
Lymphocytes Relative: 24 %
Lymphs Abs: 2.6 10*3/uL (ref 0.7–4.0)
MCH: 31.7 pg (ref 26.0–34.0)
MCHC: 33.6 g/dL (ref 30.0–36.0)
MCV: 94.3 fL (ref 80.0–100.0)
Monocytes Absolute: 0.9 10*3/uL (ref 0.1–1.0)
Monocytes Relative: 9 %
Neutro Abs: 6.7 10*3/uL (ref 1.7–7.7)
Neutrophils Relative %: 64 %
Platelets: 219 10*3/uL (ref 150–400)
RBC: 5.05 MIL/uL (ref 4.22–5.81)
RDW: 13.3 % (ref 11.5–15.5)
WBC: 10.4 10*3/uL (ref 4.0–10.5)
nRBC: 0 % (ref 0.0–0.2)

## 2021-08-26 LAB — TROPONIN I (HIGH SENSITIVITY)
Troponin I (High Sensitivity): 5 ng/L (ref <18)
Troponin I (High Sensitivity): 7 ng/L (ref <18)

## 2021-08-26 MED ORDER — OXYCODONE-ACETAMINOPHEN 5-325 MG PO TABS
1.0000 | ORAL_TABLET | Freq: Once | ORAL | Status: AC
  Administered 2021-08-26: 1 via ORAL
  Filled 2021-08-26: qty 1

## 2021-08-26 NOTE — ED Triage Notes (Signed)
Patient woke up this evening with SOB and occasional dry cough , he adds right lateral ribcage pain for several days denies injury.

## 2021-08-26 NOTE — ED Notes (Signed)
PATIENT WAS VERY UPSET ABOUT THE WAIT TIME AND LEFT .WAS UPDATE

## 2021-08-27 ENCOUNTER — Telehealth: Payer: Self-pay | Admitting: Family Medicine

## 2021-08-27 NOTE — Telephone Encounter (Signed)
Patient states:  -He was returning stella's call in regards to lab results on 08/02.   I was able to provide patient wit urology referral information per lab note. Patient requests a callback from stella to ask a couple of questions.

## 2021-08-29 ENCOUNTER — Encounter: Payer: Self-pay | Admitting: Family Medicine

## 2021-08-29 ENCOUNTER — Ambulatory Visit: Payer: BC Managed Care – PPO | Admitting: Family Medicine

## 2021-08-29 VITALS — BP 119/82 | HR 89 | Temp 98.1°F | Ht 74.0 in | Wt 291.6 lb

## 2021-08-29 DIAGNOSIS — G4733 Obstructive sleep apnea (adult) (pediatric): Secondary | ICD-10-CM | POA: Diagnosis not present

## 2021-08-29 DIAGNOSIS — R7989 Other specified abnormal findings of blood chemistry: Secondary | ICD-10-CM

## 2021-08-29 DIAGNOSIS — N529 Male erectile dysfunction, unspecified: Secondary | ICD-10-CM | POA: Diagnosis not present

## 2021-08-29 NOTE — Assessment & Plan Note (Signed)
Not adequately controlled on Cialis.  Will be referred to urology as above.

## 2021-08-29 NOTE — Progress Notes (Signed)
Clinton West is a 56 y.o. male who presents today for an office visit.  Assessment/Plan:  New/Acute Problems: Shortness of breath It is reassuring that he has not had recurrence of symptoms and his lung examtoday is reassuring.  May have had a brief aspiration event that triggered bronchospasm and shortness of breath.  History is very atypical for cardiac etiology and he had a reassuring cardiac work-up in the ED.  Additionally symptoms are nonexertional and he has not had any recurrence of symptoms despite maintaining his level of exertion-doubt cardiac or primary pulmonary etiology at this point.  He does have OSA that has been untreated due to inability to wear CPAP.  He does not wish to seek further treatment for this at this point though we did discuss referral for inspire device as below.  Given that symptoms have resolved and does not have any other red flag signs or symptoms we will continue with watchful waiting for now.  He will let me know if symptoms return  Hematuria Has referral to urology pending.   Rib Fracture Likely incidental finding.  Still has a small amount of pain but this is manageable.  Is able to take deep breath without pain.  Do not think this is contributing to the above episode of shortness of breath that he had.  Chronic Problems Addressed Today: OSA (obstructive sleep apnea) Is diet and a few years ago.  Was recommended to start CPAP however he could not tolerate this.  This has been untreated for the last 2 years.  We discussed referral to for potential inspire device however he would like to defer for now.  He will let me know if he like to have this referral placed.  Erectile dysfunction Not adequately controlled on Cialis.  Will be referred to urology as above.  Low testosterone We have not been able to get his testosterone goals despite replacement therapy.  We will be referring to urology as above.     Subjective:  HPI:  Patient went to the  ED 3 days ago with sudden onset shortness of breath. This happened upon waking up around 10pm.  His wife have him albuterol which did not help. EMS called and he was taken to the ED. Had work up in the ED including xray, EKG, and labs. His troponins were negative x2. EKG showed NSR with no ischemic changes. He left before being seen by a provider. His Xray did show that he had a rib fracture. He is not aware of any specific injury. He has had a small amount of pain  He has been home for the last few days and has not had any recurrence of symptoms. He does note that his wife said he was coughing in his sleep prior to the episode. He has noticed more heart burn and reflux when he woke up. He has been able to maintain his normal level of exertion without any difficulty.        Objective:  Physical Exam: BP 119/82   Pulse 89   Temp 98.1 F (36.7 C) (Temporal)   Ht 6\' 2"  (1.88 m)   Wt 291 lb 9.6 oz (132.3 kg)   SpO2 98%   BMI 37.44 kg/m   Gen: No acute distress, resting comfortably CV: Regular rate and rhythm with no murmurs appreciated Pulm: Normal work of breathing, clear to auscultation bilaterally with no crackles, wheezes, or rhonchi Neuro: Grossly normal, moves all extremities Psych: Normal affect and thought content  Time Spent: 45 minutes of total time was spent on the date of the encounter performing the following actions: chart review prior to seeing the patient including workup in the ED, obtaining history, performing a medically necessary exam, counseling on the treatment plan, placing orders, and documenting in our EHR.        Katina Degree. [name], MD 08/29/2021 10:29 AM

## 2021-08-29 NOTE — Patient Instructions (Addendum)
It was very nice to see you today!  Please call to schedule an appointment with the urologist at [phone].   You may benefit from the inspire device to help manage your sleep apnea.  Please let me know if you would like a referral to see a specialist to discuss this.  Your episode of shortness of breath was probably due to an aspiration event.  Please try to keep your head elevated when sleeping.  Let me know if you have any recurrence of symptoms.  Take care, Dr [name]  PLEASE NOTE:  If you had any lab tests please let us know if you have not heard back within a few days. You may see your results on mychart before we have a chance to review them but we will give you a call once they are reviewed by [place]. If we ordered any referrals today, please let us know if you have not heard from their office within the next week.   Please try these tips to maintain a healthy lifestyle:  Eat at least 3 REAL meals and 1-2 snacks per day.  Aim for no more than 5 hours between eating.  If you eat breakfast, please do so within one hour of getting up.   Each meal should contain half fruits/vegetables, one quarter protein, and one quarter carbs (no bigger than a computer mouse)  Cut down on sweet beverages. This includes juice, soda, and sweet tea.   Drink at least 1 glass of water with each meal and aim for at least 8 glasses per day  Exercise at least 150 minutes every week.

## 2021-08-29 NOTE — Assessment & Plan Note (Signed)
We have not been able to get his testosterone goals despite replacement therapy.  We will be referring to urology as above.

## 2021-08-29 NOTE — Telephone Encounter (Signed)
Patient had an OV with PCP  

## 2021-08-29 NOTE — Assessment & Plan Note (Signed)
Is diet and a few years ago.  Was recommended to start CPAP however he could not tolerate this.  This has been untreated for the last 2 years.  We discussed referral to for potential inspire device however he would like to defer for now.  He will let me know if he like to have this referral placed.

## 2021-09-25 ENCOUNTER — Other Ambulatory Visit: Payer: Self-pay | Admitting: Family Medicine

## 2021-09-25 NOTE — Telephone Encounter (Signed)
..   Encourage patient to contact the pharmacy for refills or they can request refills through [facility]:  Please schedule appointment if longer than 1 year 08/29/21  NEXT APPOINTMENT DATE: 02/08/22  MEDICATION:  amphetamine-dextroamphetamine (ADDERALL) 10 MG tablet  AND  amphetamine-dextroamphetamine (ADDERALL) 15 MG tablet  Is the patient out of medication? Yes  PHARMACY: [facility] [id number] [place], [place] Phone:  [phone]  Fax:  [phone]      Let patient know to contact pharmacy at the end of the day to make sure medication is ready.  Please notify patient to allow 48-72 hours to process

## 2021-09-28 MED ORDER — AMPHETAMINE-DEXTROAMPHETAMINE 10 MG PO TABS: 10.0000 mg | ORAL_TABLET | Freq: Two times a day (BID) | ORAL | 0 refills | Status: DC

## 2021-09-28 MED ORDER — AMPHETAMINE-DEXTROAMPHETAMINE 15 MG PO TABS: 15.0000 mg | ORAL_TABLET | Freq: Two times a day (BID) | ORAL | 0 refills | Status: DC

## 2021-10-01 ENCOUNTER — Encounter: Payer: Self-pay | Admitting: *Deleted

## 2021-11-08 ENCOUNTER — Other Ambulatory Visit: Payer: Self-pay | Admitting: Family Medicine

## 2021-11-08 ENCOUNTER — Other Ambulatory Visit: Payer: Self-pay | Admitting: *Deleted

## 2021-11-08 MED ORDER — AMPHETAMINE-DEXTROAMPHETAMINE 10 MG PO TABS: 10.0000 mg | ORAL_TABLET | Freq: Two times a day (BID) | ORAL | 0 refills | Status: DC

## 2021-11-08 MED ORDER — AMPHETAMINE-DEXTROAMPHETAMINE 15 MG PO TABS: 15.0000 mg | ORAL_TABLET | Freq: Two times a day (BID) | ORAL | 0 refills | Status: DC

## 2021-11-08 NOTE — Telephone Encounter (Signed)
   LAST APPOINTMENT DATE:  Please schedule appointment if longer than 1 year  08/31/21  NEXT APPOINTMENT DATE: 02/08/22  MEDICATION:  amphetamine-dextroamphetamine (ADDERALL) 10 MG tablet  AND  amphetamine-dextroamphetamine (ADDERALL) 15 MG tablet  AND  tamsulosin (FLOMAX) 0.4 MG CAPS capsule   Is the patient out of medication? Yes  PHARMACY:  [facility] [id number] [place], [place] Phone: [phone]  Fax: [phone]    Let patient know to contact pharmacy at the end of the day to make sure medication is ready.  Please notify patient to allow 48-72 hours to process

## 2021-12-20 ENCOUNTER — Encounter: Payer: Self-pay | Admitting: *Deleted

## 2022-01-11 ENCOUNTER — Other Ambulatory Visit: Payer: Self-pay | Admitting: Family Medicine

## 2022-01-11 NOTE — Telephone Encounter (Signed)
LAST APPOINTMENT DATE:  08/29/21  NEXT APPOINTMENT DATE: 02/08/22  MEDICATION: amphetamine-dextroamphetamine (ADDERALL) 10 MG tablet   amphetamine-dextroamphetamine (ADDERALL) 15 MG tablet    Is the patient out of medication? Yes- ran out today  PHARMACY:   [facility] [id number] [place], [place] Phone: [phone]  Fax: [phone]

## 2022-01-14 MED ORDER — AMPHETAMINE-DEXTROAMPHETAMINE 15 MG PO TABS: 15.0000 mg | ORAL_TABLET | Freq: Two times a day (BID) | ORAL | 0 refills | Status: DC

## 2022-01-14 MED ORDER — AMPHETAMINE-DEXTROAMPHETAMINE 10 MG PO TABS: 10.0000 mg | ORAL_TABLET | Freq: Two times a day (BID) | ORAL | 0 refills | Status: DC

## 2022-02-08 ENCOUNTER — Ambulatory Visit: Payer: BC Managed Care – PPO | Admitting: Family Medicine

## 2022-02-08 ENCOUNTER — Encounter: Payer: Self-pay | Admitting: Family Medicine

## 2022-02-08 VITALS — BP 131/84 | HR 86 | Temp 97.8°F | Ht 74.0 in | Wt 294.0 lb

## 2022-02-08 DIAGNOSIS — R7989 Other specified abnormal findings of blood chemistry: Secondary | ICD-10-CM | POA: Diagnosis not present

## 2022-02-08 DIAGNOSIS — F909 Attention-deficit hyperactivity disorder, unspecified type: Secondary | ICD-10-CM | POA: Diagnosis not present

## 2022-02-08 DIAGNOSIS — E1165 Type 2 diabetes mellitus with hyperglycemia: Secondary | ICD-10-CM | POA: Diagnosis not present

## 2022-02-08 LAB — POCT GLYCOSYLATED HEMOGLOBIN (HGB A1C): Hemoglobin A1C: 7 % — AB (ref 4.0–5.6)

## 2022-02-08 MED ORDER — AMPHETAMINE-DEXTROAMPHETAMINE 10 MG PO TABS: 10.0000 mg | ORAL_TABLET | Freq: Two times a day (BID) | ORAL | 0 refills | Status: DC

## 2022-02-08 MED ORDER — AMPHETAMINE-DEXTROAMPHETAMINE 15 MG PO TABS: 15.0000 mg | ORAL_TABLET | Freq: Two times a day (BID) | ORAL | 0 refills | Status: DC

## 2022-02-08 NOTE — Progress Notes (Signed)
   Clinton West is a 57 y.o. male who presents today for an office visit.  Assessment/Plan:  New/Acute Problems: Foreign Body in Left Arm No foreign bodies.  Not currently having any West or other symptoms.  We did discuss referral to surgery however will defer for now.  We discussed reasons to return to care.  Chronic Problems Addressed Today: Type 2 diabetes mellitus with hyperglycemia, without long-term current use of insulin (HCC) A1c stable 7.0.  Continue metformin 5 mg daily.  Recheck 6 months.  May consider GLP-1 agonist in the future  Attention deficit hyperactivity disorder (ADHD) Database with no red flags.  Doing well on Adderall IR 25 mg twice daily.  No significant side effects.  Medications help with ability stay focused and on task.  Will refill today.  Follow-up in 6 months.  Low testosterone He has been following with urology.  They are still having trouble with getting his testosterone levels to goal.      Subjective:  HPI:  See A/p for status of chronic conditions.    Patient is here today for follow-up.  Last saw him about 6 months ago.  Since her last visit he has been following with urology for testosterone management though still is not able to get his testosterone levels at goal.  Has been compliant with his medications.  Takes Adderall 25 mg twice daily.  Medications help with ability stay focused and on task.  No significant side effects.  He has been compliant with metformin as well.  He is also concerned about a foreign body in his left arm.  States this happened about 40 years ago when he was a teenager.  Was working with a hammer and chisel when a piece of metal flew off and struck his left arm.  He never sought medical care for this.  For years he was able to feel this on deep palpation however recently the foreign body seems to be moving towards the surface.  Does not currently have any West.        Objective:  Physical Exam: BP 131/84    Pulse 86   Temp 97.8 F (36.6 C) (Temporal)   Ht 6\' 2"  (1.88 m)   Wt 294 lb (133.4 kg)   SpO2 96%   BMI 37.75 kg/m   Gen: No acute distress, resting comfortably CV: Regular rate and rhythm with no murmurs appreciated Pulm: Normal work of breathing, clear to auscultation bilaterally with no crackles, wheezes, or rhonchi MSK: Palpable lump in left forearm about 1cm in diameter. Freely mobile. Nonpainful.  Neuro: Grossly normal, moves all extremities Psych: Normal affect and thought content      Clinton Smolinsky M. Jerline Pain, MD 02/08/2022 10:57 AM

## 2022-02-08 NOTE — Assessment & Plan Note (Signed)
He has been following with urology.  They are still having trouble with getting his testosterone levels to goal.

## 2022-02-08 NOTE — Assessment & Plan Note (Signed)
Database with no red flags.  Doing well on Adderall IR 25 mg twice daily.  No significant side effects.  Medications help with ability stay focused and on task.  Will refill today.  Follow-up in 6 months.

## 2022-02-08 NOTE — Assessment & Plan Note (Signed)
A1c stable 7.0.  Continue metformin 5 mg daily.  Recheck 6 months.  May consider GLP-1 agonist in the future

## 2022-02-08 NOTE — Patient Instructions (Signed)
It was very nice to see you today!  Your A1c looks great today.  I will refill your Adderall.  Will see you back in 6 months for your annual checkup with blood work.  Please come back to see [place] sooner if needed.  Take care, Dr Jerline Pain  PLEASE NOTE:  If you had any lab tests, please let us know if you have not heard back within a few days. You may see your results on mychart before we have a chance to review them but we will give you a call once they are reviewed by [place].   If we ordered any referrals today, please let us know if you have not heard from their office within the next week.   If you had any urgent prescriptions sent in today, please check with the pharmacy within an hour of our visit to make sure the prescription was transmitted appropriately.   Please try these tips to maintain a healthy lifestyle:  Eat at least 3 REAL meals and 1-2 snacks per day.  Aim for no more than 5 hours between eating.  If you eat breakfast, please do so within one hour of getting up.   Each meal should contain half fruits/vegetables, one quarter protein, and one quarter carbs (no bigger than a computer mouse)  Cut down on sweet beverages. This includes juice, soda, and sweet tea.   Drink at least 1 glass of water with each meal and aim for at least 8 glasses per day  Exercise at least 150 minutes every week.

## 2022-02-25 ENCOUNTER — Other Ambulatory Visit: Payer: Self-pay | Admitting: *Deleted

## 2022-02-25 MED ORDER — TADALAFIL 10 MG PO TABS: ORAL_TABLET | ORAL | 0 refills | Status: DC

## 2022-03-27 ENCOUNTER — Other Ambulatory Visit: Payer: Self-pay | Admitting: *Deleted

## 2022-03-27 NOTE — Telephone Encounter (Signed)
Patient requesting refills  Last OV 02/08/2022

## 2022-03-28 MED ORDER — AMPHETAMINE-DEXTROAMPHETAMINE 15 MG PO TABS: 15.0000 mg | ORAL_TABLET | Freq: Two times a day (BID) | ORAL | 0 refills | Status: DC

## 2022-03-28 MED ORDER — AMPHETAMINE-DEXTROAMPHETAMINE 10 MG PO TABS: 10.0000 mg | ORAL_TABLET | Freq: Two times a day (BID) | ORAL | 0 refills | Status: DC

## 2022-04-23 ENCOUNTER — Telehealth: Payer: Self-pay | Admitting: Family Medicine

## 2022-04-23 ENCOUNTER — Other Ambulatory Visit: Payer: Self-pay

## 2022-04-23 MED ORDER — TADALAFIL 10 MG PO TABS: ORAL_TABLET | ORAL | 0 refills | Status: DC

## 2022-04-23 NOTE — Telephone Encounter (Signed)
Refill request sent to PCP.

## 2022-04-23 NOTE — Telephone Encounter (Signed)
Last refill: 02/25/22 #30, 0 Last OV: 02/08/22 dx. DM, ADHD, low testosterone

## 2022-04-23 NOTE — Telephone Encounter (Signed)
Prescription Request  04/23/2022  LOV: 02/08/2022  What is the name of the medication or equipment?  tadalafil (CIALIS) 10 MG tablet   Have you contacted your pharmacy to request a refill? Yes   Which pharmacy would you like this sent to?   [facility] [id number] - [place], [place] - [address] [[id number]]    Patient notified that their request is being sent to the clinical staff for review and that they should receive a response within 2 business days.   Please advise at Mobile [phone] (mobile)

## 2022-05-09 ENCOUNTER — Other Ambulatory Visit: Payer: Self-pay | Admitting: Family Medicine

## 2022-05-09 NOTE — Telephone Encounter (Signed)
Prescription Request  05/09/2022  LOV: 02/08/2022  What is the name of the medication or equipment?  amphetamine-dextroamphetamine (ADDERALL) 10 MG tablet   amphetamine-dextroamphetamine (ADDERALL) 15 MG tablet    Have you contacted your pharmacy to request a refill? No   Which pharmacy would you like this sent to?  [facility] [id number] [place], [place] - [address] [facility] [address] [place] [address] Phone: [phone] Fax: [phone]    Patient notified that their request is being sent to the clinical staff for review and that they should receive a response within 2 business days.   Please advise at Mobile [phone] (mobile)

## 2022-05-10 MED ORDER — AMPHETAMINE-DEXTROAMPHETAMINE 10 MG PO TABS: 10.0000 mg | ORAL_TABLET | Freq: Two times a day (BID) | ORAL | 0 refills | Status: DC

## 2022-05-10 MED ORDER — AMPHETAMINE-DEXTROAMPHETAMINE 15 MG PO TABS: 15.0000 mg | ORAL_TABLET | Freq: Two times a day (BID) | ORAL | 0 refills | Status: DC

## 2022-06-10 ENCOUNTER — Other Ambulatory Visit: Payer: Self-pay | Admitting: Family Medicine

## 2022-06-20 LAB — TESTOSTERONE: Testosterone: 363.7

## 2022-06-20 LAB — PSA: PSA: 0.76

## 2022-07-08 ENCOUNTER — Other Ambulatory Visit: Payer: Self-pay | Admitting: Family Medicine

## 2022-07-08 MED ORDER — AMPHETAMINE-DEXTROAMPHETAMINE 15 MG PO TABS: 15.0000 mg | ORAL_TABLET | Freq: Two times a day (BID) | ORAL | 0 refills | Status: DC

## 2022-07-08 MED ORDER — AMPHETAMINE-DEXTROAMPHETAMINE 10 MG PO TABS: 10.0000 mg | ORAL_TABLET | Freq: Two times a day (BID) | ORAL | 0 refills | Status: DC

## 2022-07-08 NOTE — Telephone Encounter (Signed)
Prescription Request  07/08/2022  LOV: 02/08/2022  What is the name of the medication or equipment?  amphetamine-dextroamphetamine (ADDERALL) 10 MG tablet   amphetamine-dextroamphetamine (ADDERALL) 15 MG tablet    Have you contacted your pharmacy to request a refill? No   Which pharmacy would you like this sent to?  [facility] [id number] [place], [place] - [address] [facility] [address] [place] [address] Phone: [phone] Fax: [phone]    Patient notified that their request is being sent to the clinical staff for review and that they should receive a response within 2 business days.   Please advise at Mobile [phone] (mobile)

## 2022-07-08 NOTE — Telephone Encounter (Signed)
Pt requesting refill for Adderall 10 mg and 15 mg. Last OV 02/08/2022. Appt scheduled 08/09/2022.

## 2022-07-15 ENCOUNTER — Encounter: Payer: Self-pay | Admitting: Physician Assistant

## 2022-07-15 ENCOUNTER — Other Ambulatory Visit: Payer: Self-pay | Admitting: *Deleted

## 2022-07-15 ENCOUNTER — Ambulatory Visit: Payer: BC Managed Care – PPO | Admitting: Physician Assistant

## 2022-07-15 ENCOUNTER — Telehealth: Payer: Self-pay | Admitting: Family Medicine

## 2022-07-15 ENCOUNTER — Telehealth: Payer: Self-pay

## 2022-07-15 VITALS — BP 104/70 | HR 92 | Temp 97.3°F | Ht 74.0 in | Wt 276.0 lb

## 2022-07-15 DIAGNOSIS — H9201 Otalgia, right ear: Secondary | ICD-10-CM | POA: Diagnosis not present

## 2022-07-15 DIAGNOSIS — R5381 Other malaise: Secondary | ICD-10-CM

## 2022-07-15 DIAGNOSIS — R5383 Other fatigue: Secondary | ICD-10-CM

## 2022-07-15 DIAGNOSIS — D582 Other hemoglobinopathies: Secondary | ICD-10-CM

## 2022-07-15 LAB — CBC WITH DIFFERENTIAL/PLATELET
Basophils Absolute: 0.1 10*3/uL (ref 0.0–0.1)
Basophils Relative: 0.9 % (ref 0.0–3.0)
Eosinophils Absolute: 0.2 10*3/uL (ref 0.0–0.7)
Eosinophils Relative: 2.3 % (ref 0.0–5.0)
HCT: 54.1 % — ABNORMAL HIGH (ref 39.0–52.0)
Hemoglobin: 18.1 g/dL (ref 13.0–17.0)
Lymphocytes Relative: 26.7 % (ref 12.0–46.0)
Lymphs Abs: 2.8 10*3/uL (ref 0.7–4.0)
MCHC: 33.3 g/dL (ref 30.0–36.0)
MCV: 92 fl (ref 78.0–100.0)
Monocytes Absolute: 1.5 10*3/uL — ABNORMAL HIGH (ref 0.1–1.0)
Monocytes Relative: 14.6 % — ABNORMAL HIGH (ref 3.0–12.0)
Neutro Abs: 5.7 10*3/uL (ref 1.4–7.7)
Neutrophils Relative %: 55.5 % (ref 43.0–77.0)
Platelets: 242 10*3/uL (ref 150.0–400.0)
RBC: 5.88 Mil/uL — ABNORMAL HIGH (ref 4.22–5.81)
RDW: 13.9 % (ref 11.5–15.5)
WBC: 10.3 10*3/uL (ref 4.0–10.5)

## 2022-07-15 LAB — COMPREHENSIVE METABOLIC PANEL
ALT: 39 U/L (ref 0–53)
AST: 23 U/L (ref 0–37)
Albumin: 4.4 g/dL (ref 3.5–5.2)
Alkaline Phosphatase: 57 U/L (ref 39–117)
BUN: 13 mg/dL (ref 6–23)
CO2: 26 mEq/L (ref 19–32)
Calcium: 9.8 mg/dL (ref 8.4–10.5)
Chloride: 96 mEq/L (ref 96–112)
Creatinine, Ser: 0.89 mg/dL (ref 0.40–1.50)
GFR: 94.2 mL/min (ref >60.00)
Glucose, Bld: 149 mg/dL — ABNORMAL HIGH (ref 70–99)
Potassium: 4.6 mEq/L (ref 3.5–5.1)
Sodium: 133 mEq/L — ABNORMAL LOW (ref 135–145)
Total Bilirubin: 0.8 mg/dL (ref 0.2–1.2)
Total Protein: 7.9 g/dL (ref 6.0–8.3)

## 2022-07-15 LAB — URINALYSIS, ROUTINE W REFLEX MICROSCOPIC
Bilirubin Urine: NEGATIVE
Ketones, ur: NEGATIVE
Leukocytes,Ua: NEGATIVE
Nitrite: NEGATIVE
Specific Gravity, Urine: 1.015 (ref 1.000–1.030)
Total Protein, Urine: NEGATIVE
Urine Glucose: NEGATIVE
Urobilinogen, UA: 0.2 (ref 0.0–1.0)
pH: 6 (ref 5.0–8.0)

## 2022-07-15 MED ORDER — LEVOFLOXACIN 500 MG PO TABS: 500.0000 mg | ORAL_TABLET | Freq: Every day | ORAL | 0 refills | Status: AC

## 2022-07-15 MED ORDER — CEFTRIAXONE SODIUM 1 G IJ SOLR
1.00 g | Freq: Once | INTRAMUSCULAR | Status: AC
Start: 2022-07-15 — End: 2022-07-15
  Administered 2022-07-15: 1 g via INTRAMUSCULAR

## 2022-07-15 NOTE — Progress Notes (Signed)
Clinton West is a 57 y.o. male here for a new problem.  History of Present Illness:   Chief Complaint  Patient presents with   Otalgia    Pt c/o right ear discomfort, started swelling again yesterday. Pt was seen at Urgent care on 6/30 prescribed Augmentin, broke out in a rash. Started with Vertigo again yesterday. He has fallen twice in the past 4 days due to vertigo. He said he hit his head on the door.   Ear pain:  He complains of right ear pain.  His hearing is muffled and feeling itching in his ears.  He denies ear discharge or tooth pain.   He saw an urgent care prior and was given augmnetin but found no change in his symptoms.  He reports prior to his symptoms presenting he was working all day at the [employer] and woke up the next day with swollen lymph nodes around his neck and ear pain.   Last Thursday his symptoms worsened and he developed dizziness and vertigo.  He had fell a couple times on Thursday due to the vertigo. He reports history of vertigo in the past -- similar symptom(s). No unusual headaches, vision changes.  He is not eating and drinking due to decreased appetite and nausea.  He was able to drink only chocolate milk just fine without issues.  His symptoms improved yesterday but returned last night.  A few days into his symptom(s) and antibiotic(s), he developed a generalized body rash  Only one spot on his back was itching, otherwise no itching.  He is taking Claritin and Benadryl after his rashes started appear around his body.    Past Medical History:  Diagnosis Date   Arthritis    Depression    Diabetes mellitus without complication (HCC)    Glaucoma    Hyperlipidemia      Social History   Tobacco Use   Smoking status: Every Day    Packs/day: 1    Types: Cigarettes   Smokeless tobacco: Never  Substance Use Topics   Alcohol use: Yes    Comment: socially   Drug use: No    Past Surgical History:  Procedure Laterality Date   ANKLE  SURGERY     SPINAL FUSION  2012, 2013    Family History  Problem Relation Age of Onset   Diabetes Brother    Depression Brother    Hypertension Brother    Diabetes Maternal Uncle    Diabetes Maternal Grandmother    Cancer Maternal Grandmother    Hearing loss Maternal Grandmother    Alcohol abuse Father    COPD Father    Depression Father    Hyperlipidemia Father    Depression Sister    Drug abuse Sister    Hearing loss Maternal Grandfather     Allergies  Allergen Reactions   Other Other (See Comments)    Limes gives him a headache    Current Medications:   Current Outpatient Medications:    albuterol (VENTOLIN HFA) 108 (90 Base) MCG/ACT inhaler, Inhale 2 puffs into the lungs every 6 (six) hours as needed for wheezing or shortness of breath., Disp: 8 g, Rfl: 0   amphetamine-dextroamphetamine (ADDERALL) 10 MG tablet, Take 1 tablet (10 mg total) by mouth 2 (two) times daily., Disp: 60 tablet, Rfl: 0   amphetamine-dextroamphetamine (ADDERALL) 15 MG tablet, Take 1 tablet by mouth 2 (two) times daily., Disp: 60 tablet, Rfl: 0   anastrozole (ARIMIDEX) 1 MG tablet, Take 1 mg  by mouth daily., Disp: , Rfl:    Cholecalciferol (VITAMIN D3) 125 MCG (5000 UT) CAPS, Take 5,000 capsules by mouth daily., Disp: , Rfl:    Coenzyme Q10-Red Yeast Rice (CO Q-10 PLUS RED YEAST RICE PO), Take by mouth., Disp: , Rfl:    DEPO-TESTOSTERONE 200 MG/ML injection, Inject 100 mg into the muscle every 28 (twenty-eight) days., Disp: , Rfl:    HYDROcodone-acetaminophen (NORCO) 10-325 MG tablet, Take 1 tablet by mouth every 6 (six) hours as needed., Disp: , Rfl:    levofloxacin (LEVAQUIN) 500 MG tablet, Take 1 tablet (500 mg total) by mouth daily for 5 days., Disp: 5 tablet, Rfl: 0   metFORMIN (GLUCOPHAGE-XR) 500 MG 24 hr tablet, TAKE ONE TABLET BY MOUTH DAILY WITH SUPPER, Disp: 90 tablet, Rfl: 3   Multiple Vitamin (MULTIVITAMIN) tablet, Take 1 tablet by mouth daily., Disp: , Rfl:    tadalafil (CIALIS) 10 MG  tablet, TAKE 1/2 TO 1 TABLET BY MOUTH DAILY AS NEEDED FOR ERECTILE DYSFUNCTION, Disp: 30 tablet, Rfl: 0   tamsulosin (FLOMAX) 0.4 MG CAPS capsule, TAKE 1 CAPSULE BY MOUTH DAILY, Disp: 90 capsule, Rfl: 3   Review of Systems:   Review of Systems  HENT:  Positive for ear pain (right ear) and hearing loss.        (-)ear discharge (+)itching in ear (-)tooth pain  Musculoskeletal:  Positive for neck pain.  Skin:  Positive for itching (darkened spot on back).       (+)generalized darkened spots around body    Vitals:   Vitals:   07/15/22 1121  BP: 104/70  Pulse: 92  Temp: (!) 97.3 F (36.3 C)  TempSrc: Temporal  SpO2: 96%  Weight: 276 lb (125.2 kg)  Height: 6\' 2"  (1.88 m)     Body mass index is 35.44 kg/m.  Physical Exam:   Physical Exam Vitals and nursing note reviewed.  Constitutional:      General: He is not in acute distress.    Appearance: He is well-developed. He is not ill-appearing or toxic-appearing.  HENT:     Head: Normocephalic and atraumatic.     Right Ear: Ear canal and external ear normal. A middle ear effusion is present. No mastoid tenderness. Tympanic membrane is erythematous. Tympanic membrane is not retracted or bulging.     Left Ear: Tympanic membrane, ear canal and external ear normal. No mastoid tenderness. Tympanic membrane is not erythematous, retracted or bulging.     Nose: Nose normal.     Right Sinus: No maxillary sinus tenderness or frontal sinus tenderness.     Left Sinus: No maxillary sinus tenderness or frontal sinus tenderness.     Mouth/Throat:     Pharynx: Uvula midline. No posterior oropharyngeal erythema.  Eyes:     General: Lids are normal.     Conjunctiva/sclera: Conjunctivae normal.  Neck:     Trachea: Trachea normal.  Cardiovascular:     Rate and Rhythm: Normal rate and regular rhythm.     Pulses: Normal pulses.     Heart sounds: Normal heart sounds, S1 normal and S2 normal.  Pulmonary:     Effort: Pulmonary effort is normal.      Breath sounds: Normal breath sounds. No decreased breath sounds, wheezing, rhonchi or rales.  Lymphadenopathy:     Cervical: No cervical adenopathy.  Skin:    General: Skin is warm and dry.  Neurological:     Mental Status: He is alert.     GCS: GCS eye subscore is  4. GCS verbal subscore is 5. GCS motor subscore is 6.  Psychiatric:        Speech: Speech normal.        Behavior: Behavior normal. Behavior is cooperative.     Assessment and Plan:   Ear pain, right No red flags on exam.  His right ear still looks acutely infected. Stop Augmentin.  1 gram rocephin injection given today. No mastoid tenderness or neck rigidity. Start levaquin 500 mg daily x 5 days. Discussed taking medications as prescribed. Reviewed return precautions including worsening fever, SOB, worsening cough or other concerns. Push fluids and rest. I recommend that patient follow-up if symptoms worsen or persist despite treatment x 7-10 days, sooner if needed. Consider ENT referral if persists.  Malaise and fatigue Patient is requesting additional testing due to his symptom(s) - will order blood work and urinalysis today If symptom(s) worsen, instructed him to go to the ER If no improvement, recommend he reach out to [place] and we will determine next steps   I,Shehryar Baig,acting as a scribe for [employer], PA.,have documented all relevant documentation on the behalf of Jarold Motto, PA,as directed by  Jarold Motto, PA while in the presence of Jarold Motto, [place].  I, Jarold Motto, [place], have reviewed all documentation for this visit. The documentation on 07/15/22 for the exam, diagnosis, procedures, and orders are all accurate and complete.  Jarold Motto, PA-C

## 2022-07-15 NOTE — Telephone Encounter (Signed)
See results note. 

## 2022-07-15 NOTE — Telephone Encounter (Signed)
Patient returned call. Requests to be called. 

## 2022-07-15 NOTE — Patient Instructions (Signed)
It was great to see you!  Stop oral Augmentin Start oral Levaquin  You have received a Rocephin injection today too  If your symptoms worsen, go to the ER  If your symptoms persist, please reach out to me or Dr [name] and we will figure out next best steps  Take care,  Jarold Motto PA-C

## 2022-07-15 NOTE — Telephone Encounter (Signed)
[facility] called Clinton West) and stating pt has high hemoglobin at 18.1. Please advise

## 2022-07-16 LAB — URINE CULTURE
MICRO NUMBER:: 15170608
Result:: NO GROWTH
SPECIMEN QUALITY:: ADEQUATE

## 2022-07-17 ENCOUNTER — Other Ambulatory Visit (INDEPENDENT_AMBULATORY_CARE_PROVIDER_SITE_OTHER): Payer: BC Managed Care – PPO

## 2022-07-17 DIAGNOSIS — D582 Other hemoglobinopathies: Secondary | ICD-10-CM

## 2022-07-17 LAB — CBC WITH DIFFERENTIAL/PLATELET
Basophils Absolute: 0.1 10*3/uL (ref 0.0–0.1)
Basophils Relative: 1 % (ref 0.0–3.0)
Eosinophils Absolute: 0.3 10*3/uL (ref 0.0–0.7)
Eosinophils Relative: 2.7 % (ref 0.0–5.0)
HCT: 49.9 % (ref 39.0–52.0)
Hemoglobin: 16.9 g/dL (ref 13.0–17.0)
Lymphocytes Relative: 33 % (ref 12.0–46.0)
Lymphs Abs: 3.3 10*3/uL (ref 0.7–4.0)
MCHC: 33.8 g/dL (ref 30.0–36.0)
MCV: 92.3 fl (ref 78.0–100.0)
Monocytes Absolute: 1.1 10*3/uL — ABNORMAL HIGH (ref 0.1–1.0)
Monocytes Relative: 10.4 % (ref 3.0–12.0)
Neutro Abs: 5.4 10*3/uL (ref 1.4–7.7)
Neutrophils Relative %: 52.9 % (ref 43.0–77.0)
Platelets: 298 10*3/uL (ref 150.0–400.0)
RBC: 5.4 Mil/uL (ref 4.22–5.81)
RDW: 13.7 % (ref 11.5–15.5)
WBC: 10.1 10*3/uL (ref 4.0–10.5)

## 2022-07-24 ENCOUNTER — Other Ambulatory Visit: Payer: Self-pay | Admitting: Family Medicine

## 2022-07-25 ENCOUNTER — Encounter: Payer: Self-pay | Admitting: Family Medicine

## 2022-07-29 LAB — HM DIABETES EYE EXAM

## 2022-08-07 ENCOUNTER — Encounter (INDEPENDENT_AMBULATORY_CARE_PROVIDER_SITE_OTHER): Payer: Self-pay

## 2022-08-09 ENCOUNTER — Encounter: Payer: BC Managed Care – PPO | Admitting: Family Medicine

## 2022-08-20 ENCOUNTER — Other Ambulatory Visit: Payer: Self-pay | Admitting: Family Medicine

## 2022-08-20 MED ORDER — AMPHETAMINE-DEXTROAMPHETAMINE 15 MG PO TABS: 15.0000 mg | ORAL_TABLET | Freq: Two times a day (BID) | ORAL | 0 refills | Status: DC

## 2022-08-20 MED ORDER — AMPHETAMINE-DEXTROAMPHETAMINE 10 MG PO TABS: 10.0000 mg | ORAL_TABLET | Freq: Two times a day (BID) | ORAL | 0 refills | Status: DC

## 2022-08-20 NOTE — Telephone Encounter (Signed)
Prescription Request  08/20/2022  LOV: 02/08/2022  What is the name of the medication or equipment? amphetamine-dextroamphetamine (ADDERALL) 15 MG tablet  amphetamine-dextroamphetamine (ADDERALL) 10 MG tablet   Have you contacted your pharmacy to request a refill? Yes   Which pharmacy would you like this sent to?  [facility] [id number] [place], [place] - [address] [facility] [address] [place] [address] Phone: [phone] Fax: [phone]    Patient notified that their request is being sent to the clinical staff for review and that they should receive a response within 2 business days.   Please advise at Mobile [phone] (mobile)

## 2022-09-06 ENCOUNTER — Ambulatory Visit (INDEPENDENT_AMBULATORY_CARE_PROVIDER_SITE_OTHER): Payer: BC Managed Care – PPO | Admitting: Family Medicine

## 2022-09-06 VITALS — BP 115/74 | HR 84 | Temp 97.7°F | Ht 74.0 in | Wt 286.0 lb

## 2022-09-06 DIAGNOSIS — Z7984 Long term (current) use of oral hypoglycemic drugs: Secondary | ICD-10-CM

## 2022-09-06 DIAGNOSIS — F909 Attention-deficit hyperactivity disorder, unspecified type: Secondary | ICD-10-CM | POA: Diagnosis not present

## 2022-09-06 DIAGNOSIS — N529 Male erectile dysfunction, unspecified: Secondary | ICD-10-CM

## 2022-09-06 DIAGNOSIS — N400 Enlarged prostate without lower urinary tract symptoms: Secondary | ICD-10-CM | POA: Diagnosis not present

## 2022-09-06 DIAGNOSIS — E1165 Type 2 diabetes mellitus with hyperglycemia: Secondary | ICD-10-CM

## 2022-09-06 DIAGNOSIS — Z0001 Encounter for general adult medical examination with abnormal findings: Secondary | ICD-10-CM | POA: Diagnosis not present

## 2022-09-06 DIAGNOSIS — Z13220 Encounter for screening for lipoid disorders: Secondary | ICD-10-CM | POA: Diagnosis not present

## 2022-09-06 DIAGNOSIS — R7989 Other specified abnormal findings of blood chemistry: Secondary | ICD-10-CM | POA: Diagnosis not present

## 2022-09-06 DIAGNOSIS — Z1211 Encounter for screening for malignant neoplasm of colon: Secondary | ICD-10-CM

## 2022-09-06 LAB — COMPREHENSIVE METABOLIC PANEL WITH GFR
ALT: 24 U/L (ref 0–53)
AST: 18 U/L (ref 0–37)
Albumin: 4.1 g/dL (ref 3.5–5.2)
Alkaline Phosphatase: 59 U/L (ref 39–117)
BUN: 16 mg/dL (ref 6–23)
CO2: 26 meq/L (ref 19–32)
Calcium: 9.3 mg/dL (ref 8.4–10.5)
Chloride: 103 meq/L (ref 96–112)
Creatinine, Ser: 0.65 mg/dL (ref 0.40–1.50)
GFR: 103.47 mL/min
Glucose, Bld: 122 mg/dL — ABNORMAL HIGH (ref 70–99)
Potassium: 4.4 meq/L (ref 3.5–5.1)
Sodium: 137 meq/L (ref 135–145)
Total Bilirubin: 0.4 mg/dL (ref 0.2–1.2)
Total Protein: 6.8 g/dL (ref 6.0–8.3)

## 2022-09-06 LAB — PSA: PSA: 0.94 ng/mL (ref 0.10–4.00)

## 2022-09-06 LAB — MICROALBUMIN / CREATININE URINE RATIO
Creatinine,U: 75.1 mg/dL
Microalb Creat Ratio: 0.9 mg/g (ref 0.0–30.0)
Microalb, Ur: <0.7 mg/dL (ref 0.0–1.9)

## 2022-09-06 LAB — LIPID PANEL
Cholesterol: 140 mg/dL (ref 0–200)
HDL: 28.5 mg/dL — ABNORMAL LOW (ref >39.00)
LDL Cholesterol: 73 mg/dL (ref 0–99)
NonHDL: 111.03
Total CHOL/HDL Ratio: 5
Triglycerides: 190 mg/dL — ABNORMAL HIGH (ref 0.0–149.0)
VLDL: 38 mg/dL (ref 0.0–40.0)

## 2022-09-06 LAB — CBC
HCT: 51 % (ref 39.0–52.0)
Hemoglobin: 16.8 g/dL (ref 13.0–17.0)
MCHC: 32.9 g/dL (ref 30.0–36.0)
MCV: 94.5 fl (ref 78.0–100.0)
Platelets: 215 K/uL (ref 150.0–400.0)
RBC: 5.39 Mil/uL (ref 4.22–5.81)
RDW: 14.9 % (ref 11.5–15.5)
WBC: 9.7 K/uL (ref 4.0–10.5)

## 2022-09-06 LAB — TSH: TSH: 1.05 u[IU]/mL (ref 0.35–5.50)

## 2022-09-06 LAB — HEMOGLOBIN A1C: Hgb A1c MFr Bld: 7.2 % — ABNORMAL HIGH (ref 4.6–6.5)

## 2022-09-06 MED ORDER — TAMSULOSIN HCL 0.4 MG PO CAPS: 0.4000 mg | ORAL_CAPSULE | Freq: Every day | ORAL | 3 refills | Status: DC

## 2022-09-06 NOTE — Assessment & Plan Note (Signed)
Follows with urology.  On Flomax.  Check PSA today.

## 2022-09-06 NOTE — Progress Notes (Signed)
Chief Complaint:  Clinton West is a 57 y.o. male who presents today for his annual comprehensive physical exam.    Assessment/Plan:  New/Acute Problems: Prominent Xiphoid  No red flags.  Symptoms of been stable for the last several years.  Will continue with watchful waiting.  He will let us know if he has any change in symptoms.  Chronic Problems Addressed Today: Low testosterone Follows with urology.  On testosterone replacement.  Attention deficit hyperactivity disorder (ADHD) Doing well Adderall immediate release 15 mg twice daily.  Medications help with ability stay focused on task.  Database no red flags.  Does not need refill today.  Follow-up in 6 months.  Type 2 diabetes mellitus with hyperglycemia, without long-term current use of insulin (HCC) On metformin 500 mg daily.  Discussed lifestyle modifications.  Check A1c today.  BPH (benign prostatic hyperplasia) Follows with urology.  On Flomax.  Check PSA today.  Erectile dysfunction Following with urology.  On Cialis 5 to 10 mg daily as needed.  Does not need refill today.  Preventative Healthcare: Check labs.  Vaccines declined.  Due for next colonscopy in a couple of years however he wishes to have this done early due to family history.  Will place referral today.  Patient Counseling(The following topics were reviewed and/or handout was given):  -Nutrition: Stressed importance of moderation in sodium/caffeine intake, saturated fat and cholesterol, caloric balance, sufficient intake of fresh fruits, vegetables, and fiber.  -Stressed the importance of regular exercise.   -Substance Abuse: Discussed cessation/primary prevention of tobacco, alcohol, or other drug use; driving or other dangerous activities under the influence; availability of treatment for abuse.   -Injury prevention: Discussed safety belts, safety helmets, smoke detector, smoking near bedding or upholstery.   -Sexuality: Discussed sexually transmitted  diseases, partner selection, use of condoms, avoidance of unintended pregnancy and contraceptive alternatives.   -Dental health: Discussed importance of regular tooth brushing, flossing, and dental visits.  -Health maintenance and immunizations reviewed. Please refer to Health maintenance section.  Return to care in 1 year for next preventative visit.     Subjective:  HPI:  He has no acute complaints today.   He has noticed a prominent lump at the top of his abdomen.  This is been present for years.  No obvious injuries or precipitating events.  Lifestyle Diet: Balanced. Trying to get more fruits and vegetables.  Exercise: Very active with work.      09/06/2022    8:55 AM  Depression screen PHQ 2/9  Decreased Interest 0  Down, Depressed, Hopeless 0  PHQ - 2 Score 0    Health Maintenance Due  Topic Date Due   FOOT EXAM  Never done   Diabetic kidney evaluation - Urine ACR  02/08/2022   HEMOGLOBIN A1C  08/09/2022     ROS: Per HPI, otherwise a complete review of systems was negative.   PMH:  The following were reviewed and entered/updated in epic: Past Medical History:  Diagnosis Date   Arthritis    Depression    Diabetes mellitus without complication (HCC)    Glaucoma    Hyperlipidemia    Patient Active Problem List   Diagnosis Date Noted   OSA (obstructive sleep apnea) 08/29/2021   Erectile dysfunction 03/01/2020   BPH (benign prostatic hyperplasia) 11/01/2019   Low testosterone 06/01/2019   Attention deficit hyperactivity disorder (ADHD) 06/01/2019   Type 2 diabetes mellitus with hyperglycemia, without long-term current use of insulin (HCC) 06/01/2019   Chronic neck  pain 06/01/2019   Nicotine dependence with current use 06/01/2019   Past Surgical History:  Procedure Laterality Date   ANKLE SURGERY     SPINAL FUSION  2012, 2013    Family History  Problem Relation Age of Onset   Diabetes Brother    Depression Brother    Hypertension Brother    Diabetes  Maternal Uncle    Diabetes Maternal Grandmother    Cancer Maternal Grandmother    Hearing loss Maternal Grandmother    Alcohol abuse Father    COPD Father    Depression Father    Hyperlipidemia Father    Depression Sister    Drug abuse Sister    Hearing loss Maternal Grandfather     Medications- reviewed and updated Current Outpatient Medications  Medication Sig Dispense Refill   albuterol (VENTOLIN HFA) 108 (90 Base) MCG/ACT inhaler Inhale 2 puffs into the lungs every 6 (six) hours as needed for wheezing or shortness of breath. 8 g 0   amphetamine-dextroamphetamine (ADDERALL) 10 MG tablet Take 1 tablet (10 mg total) by mouth 2 (two) times daily. 60 tablet 0   amphetamine-dextroamphetamine (ADDERALL) 15 MG tablet Take 1 tablet by mouth 2 (two) times daily. 60 tablet 0   anastrozole (ARIMIDEX) 1 MG tablet Take 1 mg by mouth daily.     Cholecalciferol (VITAMIN D3) 125 MCG (5000 UT) CAPS Take 5,000 capsules by mouth daily.     Coenzyme Q10-Red Yeast Rice (CO Q-10 PLUS RED YEAST RICE PO) Take by mouth.     DEPO-TESTOSTERONE 200 MG/ML injection Inject 100 mg into the muscle every 28 (twenty-eight) days.     HYDROcodone-acetaminophen (NORCO) 10-325 MG tablet Take 1 tablet by mouth every 6 (six) hours as needed.     metFORMIN (GLUCOPHAGE-XR) 500 MG 24 hr tablet TAKE ONE TABLET BY MOUTH DAILY WITH SUPPER 30 tablet 5   Multiple Vitamin (MULTIVITAMIN) tablet Take 1 tablet by mouth daily.     tadalafil (CIALIS) 10 MG tablet TAKE 1/2 TO 1 TABLET BY MOUTH DAILY AS NEEDED FOR ERECTILE DYSFUNCTION 30 tablet 0   tamsulosin (FLOMAX) 0.4 MG CAPS capsule Take 1 capsule (0.4 mg total) by mouth daily. 90 capsule 3   No current facility-administered medications for this visit.    Allergies-reviewed and updated Allergies  Allergen Reactions   Penicillins Hives    Breaks out in Skin Bumps all over skin   Other Other (See Comments)    Limes gives him a headache    Social History   Socioeconomic  History   Marital status: Married    Spouse name: Not on file   Number of children: Not on file   Years of education: Not on file   Highest education level: Not on file  Occupational History   Not on file  Tobacco Use   Smoking status: Every Day    Current packs/day: 1.00    Types: Cigarettes   Smokeless tobacco: Never  Substance and Sexual Activity   Alcohol use: Yes    Comment: socially   Drug use: No   Sexual activity: Not on file  Other Topics Concern   Not on file  Social History Narrative   Not on file   Social Determinants of Health   Financial Resource Strain: Not on file  Food Insecurity: Not on file  Transportation Needs: Not on file  Physical Activity: Not on file  Stress: Not on file  Social Connections: Unknown (05/21/2021)   Received from [facility]   Social  Network    Social Network: Not on file        Objective:  Physical Exam: BP 115/74   Pulse 84   Temp 97.7 F (36.5 C) (Temporal)   Ht 6\' 2"  (1.88 m)   Wt 286 lb (129.7 kg)   SpO2 98%   BMI 36.72 kg/m   Body mass index is 36.72 kg/m. Wt Readings from Last 3 Encounters:  09/06/22 286 lb (129.7 kg)  07/15/22 276 lb (125.2 kg)  02/08/22 294 lb (133.4 kg)   Gen: NAD, resting comfortably HEENT: TMs normal bilaterally. OP clear. No thyromegaly noted.  CV: RRR with no murmurs appreciated Pulm: NWOB, CTAB with no crackles, wheezes, or rhonchi GI: Normal bowel sounds present. Soft, Nontender, Nondistended.  Prominent xiphoid process noted at epigastric area.  Nontender to palpation.  MSK: no edema, cyanosis, or clubbing noted Skin: warm, dry Neuro: CN2-12 grossly intact. Strength 5/5 in upper and lower extremities. Reflexes symmetric and intact bilaterally.  Psych: Normal affect and thought content     Luree Palla M. [name], MD 09/06/2022 9:28 AM

## 2022-09-06 NOTE — Assessment & Plan Note (Signed)
Following with urology.  On Cialis 5 to 10 mg daily as needed.  Does not need refill today.

## 2022-09-06 NOTE — Patient Instructions (Addendum)
It was very nice to see you today!  We will check blood work today.   Keep working on diet and exercise.   No medication changes today.   Return in about 6 months (around 03/07/2023).   Take care, Dr [name]  PLEASE NOTE:  If you had any lab tests, please let us know if you have not heard back within a few days. You may see your results on mychart before we have a chance to review them but we will give you a call once they are reviewed by [place].   If we ordered any referrals today, please let us know if you have not heard from their office within the next week.   If you had any urgent prescriptions sent in today, please check with the pharmacy within an hour of our visit to make sure the prescription was transmitted appropriately.   Please try these tips to maintain a healthy lifestyle:  Eat at least 3 REAL meals and 1-2 snacks per day.  Aim for no more than 5 hours between eating.  If you eat breakfast, please do so within one hour of getting up.   Each meal should contain half fruits/vegetables, one quarter protein, and one quarter carbs (no bigger than a computer mouse)  Cut down on sweet beverages. This includes juice, soda, and sweet tea.   Drink at least 1 glass of water with each meal and aim for at least 8 glasses per day  Exercise at least 150 minutes every week.    Preventive Care 70-25 Years Old, Male Preventive care refers to lifestyle choices and visits with your health care provider that can promote health and wellness. Preventive care visits are also called wellness exams. What can I expect for my preventive care visit? Counseling During your preventive care visit, your health care provider may ask about your: Medical history, including: Past medical problems. Family medical history. Current health, including: Emotional well-being. Home life and relationship well-being. Sexual activity. Lifestyle, including: Alcohol, nicotine or tobacco, and drug use. Access  to firearms. Diet, exercise, and sleep habits. Safety issues such as seatbelt and bike helmet use. Sunscreen use. Work and work Astronomer. Physical exam Your health care provider will check your: Height and weight. These may be used to calculate your BMI (body mass index). BMI is a measurement that tells if you are at a healthy weight. Waist circumference. This measures the distance around your waistline. This measurement also tells if you are at a healthy weight and may help predict your risk of certain diseases, such as type 2 diabetes and high blood pressure. Heart rate and blood pressure. Body temperature. Skin for abnormal spots. What immunizations do I need?  Vaccines are usually given at various ages, according to a schedule. Your health care provider will recommend vaccines for you based on your age, medical history, and lifestyle or other factors, such as travel or where you work. What tests do I need? Screening Your health care provider may recommend screening tests for certain conditions. This may include: Lipid and cholesterol levels. Diabetes screening. This is done by checking your blood sugar (glucose) after you have not eaten for a while (fasting). Hepatitis B test. Hepatitis C test. HIV (human immunodeficiency virus) test. STI (sexually transmitted infection) testing, if you are at risk. Lung cancer screening. Prostate cancer screening. Colorectal cancer screening. Talk with your health care provider about your test results, treatment options, and if necessary, the need for more tests. Follow these instructions at  home: Eating and drinking  Eat a diet that includes fresh fruits and vegetables, whole grains, lean protein, and low-fat dairy products. Take vitamin and mineral supplements as recommended by your health care provider. Do not drink alcohol if your health care provider tells you not to drink. If you drink alcohol: Limit how much you have to 0-2 drinks a  day. Know how much alcohol is in your drink. In the [place] one drink equals one 12 oz bottle of beer (355 mL), one 5 oz glass of wine (148 mL), or one 1 oz glass of hard liquor (44 mL). Lifestyle Brush your teeth every morning and night with fluoride toothpaste. Floss one time each day. Exercise for at least 30 minutes 5 or more days each week. Do not use any products that contain nicotine or tobacco. These products include cigarettes, chewing tobacco, and vaping devices, such as e-cigarettes. If you need help quitting, ask your health care provider. Do not use drugs. If you are sexually active, practice safe sex. Use a condom or other form of protection to prevent STIs. Take aspirin only as told by your health care provider. Make sure that you understand how much to take and what form to take. Work with your health care provider to find out whether it is safe and beneficial for you to take aspirin daily. Find healthy ways to manage stress, such as: Meditation, yoga, or listening to music. Journaling. Talking to a trusted person. Spending time with friends and family. Minimize exposure to UV radiation to reduce your risk of skin cancer. Safety Always wear your seat belt while driving or riding in a vehicle. Do not drive: If you have been drinking alcohol. Do not ride with someone who has been drinking. When you are tired or distracted. While texting. If you have been using any mind-altering substances or drugs. Wear a helmet and other protective equipment during sports activities. If you have firearms in your house, make sure you follow all gun safety procedures. What's next? Go to your health care provider once a year for an annual wellness visit. Ask your health care provider how often you should have your eyes and teeth checked. Stay up to date on all vaccines. This information is not intended to replace advice given to you by your health care provider. Make sure you discuss any  questions you have with your health care provider. Document Revised: 06/21/2020 Document Reviewed: 06/21/2020 Elsevier Patient Education  2024 [employer].

## 2022-09-06 NOTE — Assessment & Plan Note (Signed)
Doing well Adderall immediate release 15 mg twice daily.  Medications help with ability stay focused on task.  Database no red flags.  Does not need refill today.  Follow-up in 6 months.

## 2022-09-06 NOTE — Assessment & Plan Note (Signed)
On metformin 500 mg daily.  Discussed lifestyle modifications.  Check A1c today.

## 2022-09-06 NOTE — Assessment & Plan Note (Addendum)
Follows with urology.  On testosterone replacement.

## 2022-09-11 ENCOUNTER — Other Ambulatory Visit: Payer: Self-pay | Admitting: *Deleted

## 2022-09-11 MED ORDER — METFORMIN HCL 500 MG PO TABS: 500.0000 mg | ORAL_TABLET | Freq: Two times a day (BID) | ORAL | 1 refills | Status: AC

## 2022-09-11 NOTE — Progress Notes (Signed)
His A1c is slightly above goal.  We should increase his metformin to 500 mg twice daily.  Please send in a prescription if needed.  The rest of his labs are all stable and we can recheck everything in a year.  I would like to see him back in 3 to 6 months.  We can recheck A1c at that time.

## 2022-10-02 ENCOUNTER — Telehealth: Payer: Self-pay | Admitting: Family Medicine

## 2022-10-02 NOTE — Telephone Encounter (Signed)
Prescription Request  10/02/2022  LOV: 09/06/2022  What is the name of the medication or equipment? amphetamine-dextroamphetamine (ADDERALL) 10 MG tablet AND amphetamine-dextroamphetamine (ADDERALL) 15 MG tablet  Have you contacted your pharmacy to request a refill? Yes   Which pharmacy would you like this sent to?  [facility] [id number] [place], [place] - [address] [facility] [address] [place] [address] Phone: [phone] Fax: [phone]    Patient notified that their request is being sent to the clinical staff for review and that they should receive a response within 2 business days.   Please advise at Mobile [phone] (mobile)

## 2022-10-03 MED ORDER — AMPHETAMINE-DEXTROAMPHETAMINE 10 MG PO TABS: 10.0000 mg | ORAL_TABLET | Freq: Two times a day (BID) | ORAL | 0 refills | Status: DC

## 2022-10-03 MED ORDER — AMPHETAMINE-DEXTROAMPHETAMINE 15 MG PO TABS: 15.0000 mg | ORAL_TABLET | Freq: Two times a day (BID) | ORAL | 0 refills | Status: DC

## 2022-11-20 ENCOUNTER — Other Ambulatory Visit: Payer: Self-pay | Admitting: Family Medicine

## 2022-11-20 NOTE — Telephone Encounter (Signed)
Prescription Request  11/20/2022  LOV: 09/06/2022  What is the name of the medication or equipment?  amphetamine-dextroamphetamine (ADDERALL) 10 MG tablet   amphetamine-dextroamphetamine (ADDERALL) 15 MG tablet    Have you contacted your pharmacy to request a refill? No   Which pharmacy would you like this sent to?  [facility] [id number] [place], [place] - [address] [facility] [address] [place] [address] Phone: [phone] Fax: [phone]    Patient notified that their request is being sent to the clinical staff for review and that they should receive a response within 2 business days.   Please advise at Mobile [phone] (mobile)

## 2022-11-21 MED ORDER — AMPHETAMINE-DEXTROAMPHETAMINE 15 MG PO TABS: 15.0000 mg | ORAL_TABLET | Freq: Two times a day (BID) | ORAL | 0 refills | Status: DC

## 2022-11-21 MED ORDER — AMPHETAMINE-DEXTROAMPHETAMINE 10 MG PO TABS: 10.0000 mg | ORAL_TABLET | Freq: Two times a day (BID) | ORAL | 0 refills | Status: DC

## 2023-01-13 ENCOUNTER — Telehealth: Payer: Self-pay

## 2023-01-13 ENCOUNTER — Other Ambulatory Visit: Payer: Self-pay | Admitting: Family Medicine

## 2023-01-13 MED ORDER — AMPHETAMINE-DEXTROAMPHETAMINE 15 MG PO TABS: 15.0000 mg | ORAL_TABLET | Freq: Two times a day (BID) | ORAL | 0 refills | Status: DC

## 2023-01-13 MED ORDER — AMPHETAMINE-DEXTROAMPHETAMINE 10 MG PO TABS: 10.0000 mg | ORAL_TABLET | Freq: Two times a day (BID) | ORAL | 0 refills | Status: DC

## 2023-01-13 NOTE — Telephone Encounter (Signed)
 Copied from CRM [id number]. Topic: Clinical - Medication Refill >> Jan 13, 2023  2:43 PM Rolin D wrote: Most Recent Primary Care Visit:  Provider: KENNYTH WORTH HERO  Department: [facility]  Visit Type: PHYSICAL  Date: 09/06/2022  Medication: amphetamine -dextroamphetamine  (ADDERALL) 15 MG tablet / amphetamine -dextroamphetamine  (ADDERALL) 10 MG tablet  Has the patient contacted their pharmacy? No (Agent: If no, request that the patient contact the pharmacy for the refill. If patient does not wish to contact the pharmacy document the reason why and proceed with request.) (Agent: If yes, when and what did the pharmacy advise?)  Is this the correct pharmacy for this prescription? Yes If no, delete pharmacy and type the correct one.  This is the patient's preferred pharmacy:  [facility] [id number] - [place], [place] - [address] [facility] [address] [place] [address] Phone: [phone] Fax: [phone]   Has the prescription been filled recently? No  Is the patient out of the medication? Yes  Has the patient been seen for an appointment in the last year OR does the patient have an upcoming appointment? Yes  Can we respond through MyChart? Yes  Agent: Please be advised that Rx refills may take up to 3 business days. We ask that you follow-up with your pharmacy.  Message has been sent to [facility] to address

## 2023-01-13 NOTE — Telephone Encounter (Signed)
 Copied from CRM [id number]. Topic: Clinical - Medication Refill >> Jan 13, 2023  2:43 PM Rolin D wrote: Most Recent Primary Care Visit:  Provider: KENNYTH WORTH HERO  Department: [facility]  Visit Type: PHYSICAL  Date: 09/06/2022  Medication: amphetamine -dextroamphetamine  (ADDERALL) 15 MG tablet / amphetamine -dextroamphetamine  (ADDERALL) 10 MG tablet  Has the patient contacted their pharmacy? No (Agent: If no, request that the patient contact the pharmacy for the refill. If patient does not wish to contact the pharmacy document the reason why and proceed with request.) (Agent: If yes, when and what did the pharmacy advise?)  Is this the correct pharmacy for this prescription? Yes If no, delete pharmacy and type the correct one.  This is the patient's preferred pharmacy:  [facility] [id number] - [place], [place] - [address] [facility] [address] [place] [address] Phone: [phone] Fax: [phone]   Has the prescription been filled recently? No  Is the patient out of the medication? Yes  Has the patient been seen for an appointment in the last year OR does the patient have an upcoming appointment? Yes  Can we respond through MyChart? Yes  Agent: Please be advised that Rx refills may take up to 3 business days. We ask that you follow-up with your pharmacy.

## 2023-01-14 NOTE — Telephone Encounter (Signed)
 This was sent in yesterday.  Katina Degree. [name], MD 01/14/2023 3:07 PM

## 2023-02-13 ENCOUNTER — Ambulatory Visit: Payer: BC Managed Care – PPO | Admitting: Family Medicine

## 2023-02-13 ENCOUNTER — Encounter: Payer: Self-pay | Admitting: Family Medicine

## 2023-02-13 ENCOUNTER — Ambulatory Visit: Payer: BC Managed Care – PPO

## 2023-02-13 VITALS — BP 125/84 | HR 95 | Temp 97.9°F | Ht 74.0 in | Wt 292.0 lb

## 2023-02-13 DIAGNOSIS — M79671 Pain in right foot: Secondary | ICD-10-CM

## 2023-02-13 DIAGNOSIS — M109 Gout, unspecified: Secondary | ICD-10-CM | POA: Diagnosis not present

## 2023-02-13 DIAGNOSIS — I8390 Asymptomatic varicose veins of unspecified lower extremity: Secondary | ICD-10-CM | POA: Diagnosis not present

## 2023-02-13 DIAGNOSIS — F17200 Nicotine dependence, unspecified, uncomplicated: Secondary | ICD-10-CM | POA: Diagnosis not present

## 2023-02-13 DIAGNOSIS — M1990 Unspecified osteoarthritis, unspecified site: Secondary | ICD-10-CM | POA: Insufficient documentation

## 2023-02-13 MED ORDER — KETOROLAC TROMETHAMINE 60 MG/2ML IM SOLN
60.0000 mg | Freq: Once | INTRAMUSCULAR | Status: AC
  Administered 2023-02-13: 60 mg via INTRAMUSCULAR

## 2023-02-13 MED ORDER — COLCHICINE 0.6 MG PO CAPS: ORAL_CAPSULE | ORAL | 0 refills | Status: DC

## 2023-02-13 MED ORDER — KETOROLAC TROMETHAMINE 10 MG PO TABS: 10.0000 mg | ORAL_TABLET | Freq: Four times a day (QID) | ORAL | 0 refills | Status: DC | PRN

## 2023-02-13 NOTE — Patient Instructions (Addendum)
 It was very nice to see you today!  I think you have a flare up of gout in your foot. We will give you an injection of Toradol . Please start the Toradol  tablets tomorrow.   We will get an x-ray of your foot.  I will refer you to see the orthopedist and vascular surgeon.  Return if symptoms worsen or fail to improve.   Take care, Dr Kennyth  PLEASE NOTE:  If you had any lab tests, please let us  know if you have not heard back within a few days. You may see your results on mychart before we have a chance to review them but we will give you a call once they are reviewed by us .   If we ordered any referrals today, please let us  know if you have not heard from their office within the next week.   If you had any urgent prescriptions sent in today, please check with the pharmacy within an hour of our visit to make sure the prescription was transmitted appropriately.   Please try these tips to maintain a healthy lifestyle:  Eat at least 3 REAL meals and 1-2 snacks per day.  Aim for no more than 5 hours between eating.  If you eat breakfast, please do so within one hour of getting up.   Each meal should contain half fruits/vegetables, one quarter protein, and one quarter carbs (no bigger than a computer mouse)  Cut down on sweet beverages. This includes juice, soda, and sweet tea.   Drink at least 1 glass of water with each meal and aim for at least 8 glasses per day  Exercise at least 150 minutes every week.

## 2023-02-13 NOTE — Assessment & Plan Note (Signed)
 He is interested in getting intra-articular steroid injections for his multiple sites of osteoarthritis.  We placed referral to orthopedics today.

## 2023-02-13 NOTE — Progress Notes (Signed)
 +  Clinton West is a 58 y.o. male who presents today for an office visit.  Assessment/Plan:  New/Acute Problems: Right Foot Pain  Likely gout flare.  He does have a prior diagnosis of this that was proven via synovial fluid analysis however has not had any flareups for the last 10 years or so.  He does admit to eating quite a bit of pork recently which may have caused his recent exacerbation.  He would like to avoid steroids if possible.  He has done well with colchicine  in the past.  We will 60 mg of Toradol  today.  He can start by mouth Toradol  tomorrow.  Will also restart colchicine  as he has done well with this in the past.  We will check x-ray today to rule out any fracture or other structural abnormality.  Will refer to orthopedics today for ongoing management.  We discussed reasons to return to care.  Chronic Problems Addressed Today: Gout Flareup of pain in right foot likely secondary to gout as above.  He has had this previously diagnosed via synovial fluid aspiration-do not have any utility for checking uric acid level today.  Will be treating his acute flare as above with Toradol  and colchicine .  We can check uric acid level at his next office visit and discuss potentially starting allopurinol at that point if needed.  Osteoarthritis He is interested in getting intra-articular steroid injections for his multiple sites of osteoarthritis.  We placed referral to orthopedics today.  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  He is interested in seeing a vascular surgeon to discuss treatment options.  Will place referral today.  Nicotine dependence with current use Patient was asked about his tobacco use today and was strongly advised to quit. Patient is currently trying to quit. We reviewed treatment options to assist him quit smoking including NRT, Chantix, and Bupropion.  He is looking into starting flavored vape that does not contain nicotine.  Advised patient I do not  have any evidence to recommend or not recommend these.  He will try these and see how it works for him.  Follow up at next office visit.   Total time spent counseling approximately 5 minutes.       Subjective:  HPI:  See Assessment / plan for status of chronic conditions. Patient here today with right foot pain and swelling. This has been going on for about 3 weeks. He did have some cargo tumble on to his foot and pin it in place.  He does not think that this caused his pain.  He was able to free his foot.  Did not strain or injury at that time.  This was not a high impact event.  Pain has been severe the last few days.  Worse with certain motions.  Sometimes pain can be serious and throbbing.  He did have a gout flareup about 10 years ago and pain is similar to that.       Objective:  Physical Exam: BP 125/84   Pulse 95   Temp 97.9 F (36.6 C) (Temporal)   Ht 6' 2 (1.88 m)   Wt 292 lb (132.5 kg)   SpO2 97%   BMI 37.49 kg/m   Gen: No acute distress, resting comfortably MUSCULOSKELETAL: - Right Foot: Edema and erythema noted across dorsal aspect of foot.  Very tender to palpation.  Neurovascularly intact distally.  Full range of motion throughout.  Sensation light touch intact throughout. Neuro: Grossly normal, moves  all extremities Psych: Normal affect and thought content      Yahsir Wickens M. Kennyth, MD 02/13/2023 8:09 AM

## 2023-02-13 NOTE — Addendum Note (Signed)
 Addended by: Alverda Joe on: 02/13/2023 08:12 AM   Modules accepted: Orders

## 2023-02-13 NOTE — Assessment & Plan Note (Signed)
 Patient was asked about his tobacco use today and was strongly advised to quit. Patient is currently trying to quit. We reviewed treatment options to assist him quit smoking including NRT, Chantix, and Bupropion.  He is looking into starting flavored vape that does not contain nicotine.  Advised patient I do not have any evidence to recommend or not recommend these.  He will try these and see how it works for him.  Follow up at next office visit.   Total time spent counseling approximately 5 minutes.

## 2023-02-13 NOTE — Assessment & Plan Note (Signed)
 This is a longstanding issue.  He is interested in seeing a vascular surgeon to discuss treatment options.  Will place referral today.

## 2023-02-13 NOTE — Assessment & Plan Note (Signed)
 Flareup of pain in right foot likely secondary to gout as above.  He has had this previously diagnosed via synovial fluid aspiration-do not have any utility for checking uric acid level today.  Will be treating his acute flare as above with Toradol  and colchicine .  We can check uric acid level at his next office visit and discuss potentially starting allopurinol at that point if needed.

## 2023-02-13 NOTE — Progress Notes (Signed)
 His x-ray showed moderate arthritis.  No acute fracture.  He should let us  know if his symptoms are not improving

## 2023-03-05 ENCOUNTER — Other Ambulatory Visit: Payer: Self-pay | Admitting: Family Medicine

## 2023-03-05 NOTE — Telephone Encounter (Signed)
 02/13/2023 LOV  01/13/2023 Fill date  60/0 refills

## 2023-03-05 NOTE — Telephone Encounter (Signed)
 Copied from CRM [id number]. Topic: Clinical - Medication Refill >> Mar 05, 2023 11:47 AM Kathryne Eriksson wrote: Most Recent Primary Care Visit:  Provider: Ardith Dark  Department: [facility]  Visit Type: ACUTE  Date: 02/13/2023  Medication: amphetamine-dextroamphetamine (ADDERALL) 10 MG tablet , amphetamine-dextroamphetamine (ADDERALL) 15 MG tablet  Has the patient contacted their pharmacy? Yes (Agent: If no, request that the patient contact the pharmacy for the refill. If patient does not wish to contact the pharmacy document the reason why and proceed with request.) (Agent: If yes, when and what did the pharmacy advise?)  Is this the correct pharmacy for this prescription? Yes If no, delete pharmacy and type the correct one.  This is the patient's preferred pharmacy:  [facility] [id number] - [place], [place] - [address] [facility] [address] [place] [address] Phone: [phone] Fax: [phone]   Has the prescription been filled recently? No  Is the patient out of the medication? Yes  Has the patient been seen for an appointment in the last year OR does the patient have an upcoming appointment? Yes  Can we respond through MyChart? Yes  Agent: Please be advised that Rx refills may take up to 3 business days. We ask that you follow-up with your pharmacy.

## 2023-03-06 MED ORDER — AMPHETAMINE-DEXTROAMPHETAMINE 10 MG PO TABS: 10.0000 mg | ORAL_TABLET | Freq: Two times a day (BID) | ORAL | 0 refills | Status: DC

## 2023-03-06 MED ORDER — AMPHETAMINE-DEXTROAMPHETAMINE 15 MG PO TABS: 15.0000 mg | ORAL_TABLET | Freq: Two times a day (BID) | ORAL | 0 refills | Status: DC

## 2023-03-07 ENCOUNTER — Telehealth: Payer: Self-pay | Admitting: Family Medicine

## 2023-03-07 NOTE — Telephone Encounter (Unsigned)
 Copied from CRM [id number]. Topic: General - Other >> Mar 07, 2023  3:05 PM Alona Bene A wrote: Reason for CRM: Patient called in regarding Dr. [name] advising to let him know who the coworker was that he referred him to see. The persons name is Karlene Einstein.

## 2023-03-07 NOTE — Telephone Encounter (Signed)
 See note

## 2023-03-25 ENCOUNTER — Ambulatory Visit (INDEPENDENT_AMBULATORY_CARE_PROVIDER_SITE_OTHER)

## 2023-03-25 ENCOUNTER — Encounter: Payer: Self-pay | Admitting: Family Medicine

## 2023-03-25 ENCOUNTER — Ambulatory Visit: Admitting: Family Medicine

## 2023-03-25 VITALS — BP 130/80 | HR 86 | Temp 97.0°F | Ht 74.0 in | Wt 288.4 lb

## 2023-03-25 DIAGNOSIS — M109 Gout, unspecified: Secondary | ICD-10-CM

## 2023-03-25 DIAGNOSIS — R0781 Pleurodynia: Secondary | ICD-10-CM

## 2023-03-25 DIAGNOSIS — N400 Enlarged prostate without lower urinary tract symptoms: Secondary | ICD-10-CM

## 2023-03-25 DIAGNOSIS — R0789 Other chest pain: Secondary | ICD-10-CM

## 2023-03-25 DIAGNOSIS — E1165 Type 2 diabetes mellitus with hyperglycemia: Secondary | ICD-10-CM

## 2023-03-25 MED ORDER — TAMSULOSIN HCL 0.4 MG PO CAPS: 0.4000 mg | ORAL_CAPSULE | Freq: Every day | ORAL | 3 refills | Status: DC

## 2023-03-25 MED ORDER — COLCHICINE 0.6 MG PO CAPS
ORAL_CAPSULE | ORAL | 0 refills | Status: DC
Start: 2023-03-25 — End: 2023-06-30

## 2023-03-25 MED ORDER — KETOROLAC TROMETHAMINE 60 MG/2ML IM SOLN
60.0000 mg | Freq: Once | INTRAMUSCULAR | Status: AC
  Administered 2023-03-25: 60 mg via INTRAMUSCULAR

## 2023-03-25 MED ORDER — KETOROLAC TROMETHAMINE 10 MG PO TABS: 10.0000 mg | ORAL_TABLET | Freq: Four times a day (QID) | ORAL | 0 refills | Status: DC | PRN

## 2023-03-25 NOTE — Addendum Note (Signed)
 Addended by: Dyann Kief on: 03/25/2023 12:13 PM   Modules accepted: Orders

## 2023-03-25 NOTE — Patient Instructions (Addendum)
 It was very nice to see you today!  I think you probably either fracture or separated your rib.  Will check an x-ray today to rule out a fracture.  Will give you an injection of Toradol.  You can start the Toradol wipeout tomorrow.  Can also use your home pain meds.  Will probably take a few weeks for this to heal up.  Please let us know if your symptoms worsen or fail to improve over the next few weeks.  Return if symptoms worsen or fail to improve.   Take care, Dr [name]  PLEASE NOTE:  If you had any lab tests, please let us know if you have not heard back within a few days. You may see your results on mychart before we have a chance to review them but we will give you a call once they are reviewed by [place].   If we ordered any referrals today, please let us know if you have not heard from their office within the next week.   If you had any urgent prescriptions sent in today, please check with the pharmacy within an hour of our visit to make sure the prescription was transmitted appropriately.   Please try these tips to maintain a healthy lifestyle:  Eat at least 3 REAL meals and 1-2 snacks per day.  Aim for no more than 5 hours between eating.  If you eat breakfast, please do so within one hour of getting up.   Each meal should contain half fruits/vegetables, one quarter protein, and one quarter carbs (no bigger than a computer mouse)  Cut down on sweet beverages. This includes juice, soda, and sweet tea.   Drink at least 1 glass of water with each meal and aim for at least 8 glasses per day  Exercise at least 150 minutes every week.

## 2023-03-25 NOTE — Progress Notes (Signed)
 X-ray shows likely rib fracture.  He should continue with the treatment plan we discussed at his office visit and let us know if not improving.

## 2023-03-25 NOTE — Assessment & Plan Note (Signed)
 He had a flareup of gout in his right foot a month ago.  This resolved with colchicine and Toradol.  We can check uric acid level Will check blood work.

## 2023-03-25 NOTE — Assessment & Plan Note (Signed)
 Stable on Flomax 0.4 mg daily.  Also follows with urology.  Will refill his Flomax today.

## 2023-03-25 NOTE — Progress Notes (Signed)
=    Clinton West is a 58 y.o. male who presents today for an office visit.  Assessment/Plan:  New/Acute Problems: Right Rib Pain  Concern for possible rib fracture.  Also possible contusion or costochondral separation.  No red flags.  We discussed importance of pain control.  He has Norco at home and does not need any more of this.  He can use this as needed.  Will give 60 mg of Toradol today.  He can start by mouth Toradol tomorrow.  He can also use lidocaine patches as needed.  Discussed with patient this will likely take a few weeks to fully heal up.  He will let us know if not improving or if symptoms worsen.  We discussed reasons to return to care.  Chronic Problems Addressed Today: Gout He had a flareup of gout in his right foot a month ago.  This resolved with colchicine and Toradol.  We can check uric acid level Will check blood work.  BPH (benign prostatic hyperplasia) Stable on Flomax 0.4 mg daily.  Also follows with urology.  Will refill his Flomax today.     Subjective:  HPI:  See A/P for status of chronic conditions.  Patient is here today with rib pain.  This started a week ago.  He injured ribs at work.  States that he was reaching over a metal rail at work.  Object that he was trying to reach was slightly out of reach and he put his weight on his right rib.  He immediately noticed pain to the area.  Felt like the rib slipped in between his ribs.  He also had some associated back pain with this as well.  Pain was manageable for a few days however significantly worsened a couple of days ago after having a coughing fit while drinking a beverage.  Since then he has had persistent pain.  It has been difficult for him to find a comfortable position to sit or lay on.  He has had some relief with hydrocodone.  Is also tried lidocaine patches with minimal improvement.        Objective:  Physical Exam: BP 130/80   Pulse 86   Temp (!) 97 F (36.1 C) (Temporal)   Ht 6\' 2"   (1.88 m)   Wt 288 lb 6.4 oz (130.8 kg)   SpO2 94%   BMI 37.03 kg/m   Gen: No acute distress, resting comfortably MUSCULOSKELETAL - Ribs: No deformities noted.  No ecchymoses.  They are tender to palpation along lower costal margin of right rib. Neuro: Grossly normal, moves all extremities Psych: Normal affect and thought content      Alvaretta Eisenberger M. [name], MD 03/25/2023 11:46 AM

## 2023-03-26 ENCOUNTER — Other Ambulatory Visit: Payer: Self-pay

## 2023-03-26 DIAGNOSIS — I8390 Asymptomatic varicose veins of unspecified lower extremity: Secondary | ICD-10-CM

## 2023-04-07 ENCOUNTER — Ambulatory Visit (HOSPITAL_COMMUNITY)
Admission: RE | Admit: 2023-04-07 | Discharge: 2023-04-07 | Disposition: A | Discharge status: Home / Self Care | Payer: BC Managed Care – PPO | Source: Ambulatory Visit | Attending: Surgery | Admitting: Surgery

## 2023-04-07 ENCOUNTER — Ambulatory Visit: Payer: BC Managed Care – PPO | Admitting: Physician Assistant

## 2023-04-07 VITALS — BP 116/79 | HR 90 | Temp 98.4°F | Ht 74.0 in | Wt 289.4 lb

## 2023-04-07 DIAGNOSIS — M7989 Other specified soft tissue disorders: Secondary | ICD-10-CM

## 2023-04-07 DIAGNOSIS — I83811 Varicose veins of right lower extremities with pain: Secondary | ICD-10-CM | POA: Diagnosis not present

## 2023-04-07 DIAGNOSIS — I8390 Asymptomatic varicose veins of unspecified lower extremity: Secondary | ICD-10-CM | POA: Diagnosis present

## 2023-04-07 DIAGNOSIS — I872 Venous insufficiency (chronic) (peripheral): Secondary | ICD-10-CM | POA: Diagnosis not present

## 2023-04-07 DIAGNOSIS — I8391 Asymptomatic varicose veins of right lower extremity: Secondary | ICD-10-CM | POA: Diagnosis not present

## 2023-04-07 NOTE — Progress Notes (Unsigned)
 Requested by:  Ardith Dark, MD [address] [place],  [place] [address]  Reason for consultation: right lower extremity edema    History of Present Illness   Clinton West is a 58 y.o. (1965-09-06) male who presents for evaluation of right lower extremity edema and painful varicose veins.  He states he has had right lower leg swelling for several years and it continues to worsen with time.  He has also had several varicose veins present for at least 5 years, and more have popped up over time.  He says usually in the mornings his leg swelling and varicose veins do not bother him, however by the end of the day his legs are very swollen, heavy, and painful.  He says he feels aching pain in his veins by the end of the day as well.   He denies any bleeding events or ulcerations.  He denies any history of DVT or previous vein procedures.  He says he has tried compression stockings before, however he could not tolerate them for long because they were "too tight".  He says his leg swelling is aggravated by his long work shifts, where he spends anywhere between 12 to 15 hours a day standing in steel toed boots.  He tries to elevate his legs after he gets off work, which he says moderately helps.  He also reports worsening issues with constant foot numbness and occasional burning sensations of his feet.  He also has chronic pain in his shoulders due to cervical spine disease and intermittent pain flares in his feet due to gout.  Past Medical History:  Diagnosis Date   Arthritis    Depression    Diabetes mellitus without complication (HCC)    Glaucoma    Hyperlipidemia     Past Surgical History:  Procedure Laterality Date   ANKLE SURGERY     SHOULDER ARTHROSCOPY Left 2023   SPINAL FUSION  2012, 2013    Social History   Socioeconomic History   Marital status: Married    Spouse name: Not on file   Number of children: Not on file   Years of education: Not on file   Highest  education level: Not on file  Occupational History   Not on file  Tobacco Use   Smoking status: Every Day    Current packs/day: 1.00    Types: Cigarettes   Smokeless tobacco: Never  Substance and Sexual Activity   Alcohol use: Yes    Comment: socially   Drug use: No   Sexual activity: Not on file  Other Topics Concern   Not on file  Social History Narrative   Not on file   Social Drivers of Health   Financial Resource Strain: Not on file  Food Insecurity: Not on file  Transportation Needs: Not on file  Physical Activity: Not on file  Stress: Not on file  Social Connections: Unknown (05/21/2021)   Received from [facility], [facility]   Social Network    Social Network: Not on file  Intimate Partner Violence: Unknown (04/13/2021)   Received from [facility], [facility]   HITS    Physically Hurt: Not on file    Insult or Talk Down To: Not on file    Threaten Physical Harm: Not on file    Scream or Curse: Not on file    Family History  Problem Relation Age of Onset   Diabetes Brother    Depression Brother    Hypertension  Brother    Diabetes Maternal Uncle    Diabetes Maternal Grandmother    Cancer Maternal Grandmother    Hearing loss Maternal Grandmother    Alcohol abuse Father    COPD Father    Depression Father    Hyperlipidemia Father    Depression Sister    Drug abuse Sister    Hearing loss Maternal Grandfather     Current Outpatient Medications  Medication Sig Dispense Refill   amphetamine-dextroamphetamine (ADDERALL) 10 MG tablet Take 1 tablet (10 mg total) by mouth 2 (two) times daily. 60 tablet 0   amphetamine-dextroamphetamine (ADDERALL) 15 MG tablet Take 1 tablet by mouth 2 (two) times daily. 60 tablet 0   anastrozole (ARIMIDEX) 1 MG tablet Take 1 mg by mouth daily.     Cholecalciferol (VITAMIN D3) 125 MCG (5000 UT) CAPS Take 5,000 capsules by mouth daily.     Coenzyme Q10-Red Yeast Rice (CO Q-10 PLUS RED YEAST RICE PO) Take by mouth.      Colchicine 0.6 MG CAPS Day 1: Take 2 caps, then 1 cap an hour later. Day 2 and beyond: 1 cap daily 30 capsule 0   DEPO-TESTOSTERONE 200 MG/ML injection Inject 100 mg into the muscle every 28 (twenty-eight) days.     HYDROcodone-acetaminophen (NORCO) 10-325 MG tablet Take 1 tablet by mouth every 6 (six) hours as needed.     ketorolac (TORADOL) 10 MG tablet Take 1 tablet (10 mg total) by mouth every 6 (six) hours as needed. 20 tablet 0   loratadine (CLARITIN) 10 MG tablet Take 10 mg by mouth daily.     metFORMIN (GLUCOPHAGE) 500 MG tablet Take 1 tablet (500 mg total) by mouth 2 (two) times daily with a meal. 180 tablet 1   Multiple Vitamin (MULTIVITAMIN) tablet Take 1 tablet by mouth daily.     tadalafil (CIALIS) 10 MG tablet TAKE 1/2 TO 1 TABLET BY MOUTH DAILY AS NEEDED FOR ERECTILE DYSFUNCTION 30 tablet 0   tamsulosin (FLOMAX) 0.4 MG CAPS capsule Take 1 capsule (0.4 mg total) by mouth daily. 90 capsule 3   albuterol (VENTOLIN HFA) 108 (90 Base) MCG/ACT inhaler Inhale 2 puffs into the lungs every 6 (six) hours as needed for wheezing or shortness of breath. (Patient not taking: Reported on 04/07/2023) 8 g 0   No current facility-administered medications for this visit.    Allergies  Allergen Reactions   Penicillins Hives    Breaks out in Skin Bumps all over skin   Other Other (See Comments)    Limes gives him a headache    REVIEW OF SYSTEMS (negative unless checked):   Cardiac:  []  Chest pain or chest pressure? []  Shortness of breath upon activity? []  Shortness of breath when lying flat? []  Irregular heart rhythm?  Vascular:  []  Pain in calf, thigh, or hip brought on by walking? []  Pain in feet at night that wakes you up from your sleep? []  Blood clot in your veins? [x]  Leg swelling?  Pulmonary:  []  Oxygen at home? []  Productive cough? []  Wheezing?  Neurologic:  []  Sudden weakness in arms or legs? []  Sudden numbness in arms or legs? []  Sudden onset of difficult speaking or  slurred speech? []  Temporary loss of vision in one eye? []  Problems with dizziness?  Gastrointestinal:  []  Blood in stool? []  Vomited blood?  Genitourinary:  []  Burning when urinating? []  Blood in urine?  Psychiatric:  []  Major depression  Hematologic:  []  Bleeding problems? []  Problems with blood clotting?  Dermatologic:  []  Rashes or ulcers?  Constitutional:  []  Fever or chills?  Ear/Nose/Throat:  []  Change in hearing? []  Nose bleeds? []  Sore throat?  Musculoskeletal:  []  Back pain? [x]  Joint pain? []  Muscle pain?   Physical Examination     Vitals:   04/07/23 1227  BP: 116/79  Pulse: 90  Temp: 98.4 F (36.9 C)  TempSrc: Temporal  SpO2: 96%  Weight: 289 lb 6.4 oz (131.3 kg)  Height: 6\' 2"  (1.88 m)   Body mass index is 37.16 kg/m.  General:  WDWN in NAD; vital signs documented above Gait: Not observed HENT: WNL, normocephalic Pulmonary: normal non-labored breathing , without Rales, rhonchi,  wheezing Cardiac: regular Abdomen: soft, NT, no masses Skin: without rashes Vascular Exam/Pulses: BLE warm and well perfused Extremities: RLL with varicose veins, with reticular veins, with 1+ edema, with mild stasis pigmentation, without lipodermatosclerosis, without ulcers Musculoskeletal: no muscle wasting or atrophy  Neurologic: A&O X 3;  No focal weakness or paresthesias are detected Psychiatric:  The pt has Normal affect.       Non-invasive Vascular Imaging   RLE Venous Insufficiency Duplex (04/07/2023):  --------------+---------+------+-----------+------------+--------+  RIGHT        Reflux NoRefluxReflux TimeDiameter cmsComments                          Yes                                   +--------------+---------+------+-----------+------------+--------+  CFV                    yes   >1 second                       +--------------+---------+------+-----------+------------+--------+  FV mid        no                                               +--------------+---------+------+-----------+------------+--------+  Popliteal    no                                              +--------------+---------+------+-----------+------------+--------+  GSV at SFJ              yes    >500 ms      0.64              +--------------+---------+------+-----------+------------+--------+  GSV prox thigh          yes    >500 ms      0.67              +--------------+---------+------+-----------+------------+--------+  GSV mid thigh           yes    >500 ms      0.58              +--------------+---------+------+-----------+------------+--------+  GSV dist thigh          yes    >500 ms      0.75              +--------------+---------+------+-----------+------------+--------+  GSV at knee  yes    >500 ms      0.65              +--------------+---------+------+-----------+------------+--------+  GSV prox calf           yes    >500 ms      0.65              +--------------+---------+------+-----------+------------+--------+  SSV Pop Fossa no                            0.49              +--------------+---------+------+-----------+------------+--------+  SSV prox calf no                            0.26              +--------------+---------+------+-----------+------------+--------+  SSV mid calf  no                            0.4               +--------------+---------+------+-----------+------------+--------+     Medical Decision Making   Clinton West is a 58 y.o. male who presents for venous insufficiency evaluation  Based on the patient's duplex, there is reflux in the patient's right common femoral vein and greater saphenous vein from the saphenofemoral junction to the proximal calf.  The remainder of his deep and superficial venous system is competent.  There is no evidence of DVT or SVT on exam.  He is a potential candidate for  ablation of his right greater saphenous vein, given that it is incompetent throughout the entire thigh and greater than 4 mm. He describes a several year history of worsening right lower extremity swelling and painful varicose veins.  His symptoms are aggravated by prolonged standing at work.  He says he has to stand for at least 12 to 15 hours daily at work.  He elevates his legs sometimes when he gets home from work, which moderately helps with his symptoms.  He has tried to wear knee-high compression stockings in the past, however he did not tolerate these for too long because they were "too tight" On exam he has 1+ edema of his right lower leg.  He does have several medium sized varicose veins in his distal calf.  He also has some mild stasis pigmentation I have explained to the patient that he could potentially be a candidate for saphenous vein ablation.  I explained that he would have to commit to wearing thigh-high compression stockings daily, maintain a healthy weight, and elevate his legs above his heart daily for at least 3 months prior to his procedure.  At this time he is not interested in trying thigh-high compression stockings.  For now the patient would like to attempt conservative treatment including knee-high compression stockings, leg elevation, exercise, and weight control.  He was measured for and given several pairs of 15 to 20 mmHg knee-high compression stockings.  He will wear these daily while at work and work on leg elevation above his heart when he is at home.  He can follow-up with our office as needed or if he decides he would like to pursue ablation therapy in the future  Kenlynn Houde Sharin Mons, PA-C [facility] Office: [phone]  04/07/2023, 12:36 PM  Clinic MD:  [place]

## 2023-04-29 ENCOUNTER — Other Ambulatory Visit: Payer: Self-pay | Admitting: Orthopaedic Surgery

## 2023-04-29 ENCOUNTER — Other Ambulatory Visit: Payer: Self-pay | Admitting: Family Medicine

## 2023-04-29 ENCOUNTER — Telehealth: Payer: Self-pay | Admitting: Family Medicine

## 2023-04-29 DIAGNOSIS — M25512 Pain in left shoulder: Secondary | ICD-10-CM

## 2023-04-29 LAB — PSA: PSA: 0.78

## 2023-04-29 LAB — TESTOSTERONE: Testosterone: 431.6

## 2023-04-29 NOTE — Telephone Encounter (Signed)
 Copied from CRM [id number]. Topic: Clinical - Medication Refill >> Apr 29, 2023 12:45 PM Danae Duncans wrote: Most Recent Primary Care Visit:  Provider: Rodney Clamp  Department: [facility]  Visit Type: ACUTE  Date: 03/25/2023  Medication: amphetamine -dextroamphetamine  (ADDERALL) 10 MG tablet amphetamine -dextroamphetamine  (ADDERALL) 15 MG tablet  Has the patient contacted their pharmacy? Yes (Agent: If no, request that the patient contact the pharmacy for the refill. If patient does not wish to contact the pharmacy document the reason why and proceed with request.) (Agent: If yes, when and what did the pharmacy advise?) [facility] refills  Is this the correct pharmacy for this prescription? Yes If no, delete pharmacy and type the correct one.  This is the patient's preferred pharmacy:  [facility] [id number] - , [place] - [address] [place] [address] Phone: [phone] Fax: [phone]   Has the prescription been filled recently? No  Is the patient out of the medication? Yes- has only one day left  Has the patient been seen for an appointment in the last year OR does the patient have an upcoming appointment? Yes  Can we respond through MyChart? Yes  Agent: Please be advised that Rx refills may take up to 3 business days. We ask that you follow-up with your pharmacy.

## 2023-04-29 NOTE — Telephone Encounter (Signed)
 Received faxed document Surgical Clearance, to be filled out by provider. Patient requested to send it back via Fax Document is located in providers tray at front office.Please advise .

## 2023-04-30 NOTE — Telephone Encounter (Signed)
 Placed in PCP office to be review

## 2023-05-01 ENCOUNTER — Ambulatory Visit
Admission: RE | Admit: 2023-05-01 | Discharge: 2023-05-01 | Disposition: A | Discharge status: Home / Self Care | Source: Ambulatory Visit | Attending: Orthopaedic Surgery | Admitting: Orthopaedic Surgery

## 2023-05-01 DIAGNOSIS — M25512 Pain in left shoulder: Secondary | ICD-10-CM

## 2023-05-02 DIAGNOSIS — Z0279 Encounter for issue of other medical certificate: Secondary | ICD-10-CM

## 2023-05-05 NOTE — Telephone Encounter (Signed)
 Form faxed to [phone] Form placed to be scan in patient chart

## 2023-05-06 ENCOUNTER — Other Ambulatory Visit: Payer: Self-pay | Admitting: *Deleted

## 2023-05-06 MED ORDER — AMPHETAMINE-DEXTROAMPHETAMINE 10 MG PO TABS: 10.0000 mg | ORAL_TABLET | Freq: Two times a day (BID) | ORAL | 0 refills | Status: DC

## 2023-05-06 MED ORDER — AMPHETAMINE-DEXTROAMPHETAMINE 15 MG PO TABS: 15.0000 mg | ORAL_TABLET | Freq: Two times a day (BID) | ORAL | 0 refills | Status: DC

## 2023-05-06 NOTE — Telephone Encounter (Signed)
 Copied from CRM [id number]. Topic: Clinical - Medication Question >> May 05, 2023  4:59 PM Abigail D wrote: Reason for CRM: Patient is still waiting on his medication refill for his amphetamine -dextroamphetamine  (ADDERALL) 10 MG tablet and amphetamine -dextroamphetamine  (ADDERALL) 15 MG tablet. Patient said he is fully out of this medication.

## 2023-05-14 ENCOUNTER — Encounter: Payer: Self-pay | Admitting: Family Medicine

## 2023-06-30 ENCOUNTER — Other Ambulatory Visit: Payer: Self-pay | Admitting: Family Medicine

## 2023-06-30 NOTE — Telephone Encounter (Unsigned)
 Copied from CRM [id number]. Topic: Clinical - Medication Refill >> Jun 30, 2023  1:04 PM [place] J wrote: Medication:  amphetamine -dextroamphetamine  (ADDERALL) 15 MG tablet amphetamine -dextroamphetamine  (ADDERALL) 10 MG tablet Colchicine  0.6 MG CAPS tadalafil  (CIALIS ) 10 MG tablet  Has the patient contacted their pharmacy? No (Agent: If no, request that the patient contact the pharmacy for the refill. If patient does not wish to contact the pharmacy document the reason why and proceed with request.) (Agent: If yes, when and what did the pharmacy advise?) Patient thought it would be best to call directly.   This is the patient's preferred pharmacy:  [facility] [id number] - [place], [place] - [address] [place] [place] [address] Phone: [phone] Fax: [phone]  Is this the correct pharmacy for this prescription? Yes If no, delete pharmacy and type the correct one.   Has the prescription been filled recently? No  Is the patient out of the medication? Yes  Has the patient been seen for an appointment in the last year OR does the patient have an upcoming appointment? Yes  Can we respond through MyChart? Yes  Agent: Please be advised that Rx refills may take up to 3 business days. We ask that you follow-up with your pharmacy.

## 2023-07-01 ENCOUNTER — Telehealth: Payer: Self-pay

## 2023-07-01 ENCOUNTER — Other Ambulatory Visit (HOSPITAL_COMMUNITY): Payer: Self-pay

## 2023-07-01 ENCOUNTER — Encounter: Payer: Self-pay | Admitting: Family Medicine

## 2023-07-01 ENCOUNTER — Ambulatory Visit: Admitting: Family Medicine

## 2023-07-01 VITALS — BP 119/73 | HR 92 | Temp 97.7°F | Ht 74.0 in | Wt 283.8 lb

## 2023-07-01 DIAGNOSIS — N400 Enlarged prostate without lower urinary tract symptoms: Secondary | ICD-10-CM | POA: Diagnosis not present

## 2023-07-01 DIAGNOSIS — Z13220 Encounter for screening for lipoid disorders: Secondary | ICD-10-CM | POA: Diagnosis not present

## 2023-07-01 DIAGNOSIS — N529 Male erectile dysfunction, unspecified: Secondary | ICD-10-CM

## 2023-07-01 DIAGNOSIS — E1165 Type 2 diabetes mellitus with hyperglycemia: Secondary | ICD-10-CM | POA: Diagnosis not present

## 2023-07-01 DIAGNOSIS — F909 Attention-deficit hyperactivity disorder, unspecified type: Secondary | ICD-10-CM

## 2023-07-01 DIAGNOSIS — R7989 Other specified abnormal findings of blood chemistry: Secondary | ICD-10-CM

## 2023-07-01 LAB — CBC
HCT: 55.7 % — ABNORMAL HIGH (ref 39.0–52.0)
Hemoglobin: 18.9 g/dL (ref 13.0–17.0)
MCHC: 33.9 g/dL (ref 30.0–36.0)
MCV: 93.4 fl (ref 78.0–100.0)
Platelets: 193 10*3/uL (ref 150.0–400.0)
RBC: 5.96 Mil/uL — ABNORMAL HIGH (ref 4.22–5.81)
RDW: 14.2 % (ref 11.5–15.5)
WBC: 10.8 10*3/uL — ABNORMAL HIGH (ref 4.0–10.5)

## 2023-07-01 LAB — LIPID PANEL
Cholesterol: 173 mg/dL (ref 0–200)
HDL: 31.5 mg/dL — ABNORMAL LOW (ref >39.00)
LDL Cholesterol: 100 mg/dL — ABNORMAL HIGH (ref 0–99)
NonHDL: 141.84
Total CHOL/HDL Ratio: 6
Triglycerides: 208 mg/dL — ABNORMAL HIGH (ref 0.0–149.0)
VLDL: 41.6 mg/dL — ABNORMAL HIGH (ref 0.0–40.0)

## 2023-07-01 LAB — HEMOGLOBIN A1C: Hgb A1c MFr Bld: 8 % — ABNORMAL HIGH (ref 4.6–6.5)

## 2023-07-01 LAB — COMPREHENSIVE METABOLIC PANEL WITH GFR
ALT: 35 U/L (ref 0–53)
AST: 24 U/L (ref 0–37)
Albumin: 4.5 g/dL (ref 3.5–5.2)
Alkaline Phosphatase: 66 U/L (ref 39–117)
BUN: 17 mg/dL (ref 6–23)
CO2: 28 meq/L (ref 19–32)
Calcium: 9.7 mg/dL (ref 8.4–10.5)
Chloride: 97 meq/L (ref 96–112)
Creatinine, Ser: 0.77 mg/dL (ref 0.40–1.50)
GFR: 97.75 mL/min (ref >60.00)
Glucose, Bld: 168 mg/dL — ABNORMAL HIGH (ref 70–99)
Potassium: 4.3 meq/L (ref 3.5–5.1)
Sodium: 133 meq/L — ABNORMAL LOW (ref 135–145)
Total Bilirubin: 0.9 mg/dL (ref 0.2–1.2)
Total Protein: 7.3 g/dL (ref 6.0–8.3)

## 2023-07-01 LAB — TESTOSTERONE: Testosterone: 1003.1 ng/dL — ABNORMAL HIGH (ref 300.00–890.00)

## 2023-07-01 LAB — PSA: PSA: 0.93 ng/mL (ref 0.10–4.00)

## 2023-07-01 LAB — TSH: TSH: 0.99 u[IU]/mL (ref 0.35–5.50)

## 2023-07-01 MED ORDER — COLCHICINE 0.6 MG PO CAPS: ORAL_CAPSULE | ORAL | 0 refills | Status: DC

## 2023-07-01 MED ORDER — AMPHETAMINE-DEXTROAMPHETAMINE 15 MG PO TABS: 15.0000 mg | ORAL_TABLET | Freq: Two times a day (BID) | ORAL | 0 refills | Status: DC

## 2023-07-01 MED ORDER — TADALAFIL 10 MG PO TABS: ORAL_TABLET | ORAL | 0 refills | Status: AC

## 2023-07-01 MED ORDER — AMPHETAMINE-DEXTROAMPHETAMINE 10 MG PO TABS: 10.0000 mg | ORAL_TABLET | Freq: Two times a day (BID) | ORAL | 0 refills | Status: DC

## 2023-07-01 NOTE — Assessment & Plan Note (Signed)
 Doing well with current regimen Adderall IR 25 mg twice daily.  Refill was sent in earlier today.  Tolerating well.  No significant side effects.  Medications help with ability stay focused and on task.

## 2023-07-01 NOTE — Telephone Encounter (Signed)
 Darice at [facility] called to report pt hemoglobin 18.9 critical; also advised Dr Katrinka since PCP out of the office.

## 2023-07-01 NOTE — Assessment & Plan Note (Signed)
 On Flomax  0.4 mg daily.  Follows with urology.  Still having some occasional symptoms with this.  Advised him to discuss with urology at his next visit with him.

## 2023-07-01 NOTE — Assessment & Plan Note (Addendum)
 On Cialis  5 to 10 mg daily as needed.  It is okay for him to increase to 15 to 20 mg daily as needed as well.  He can also discuss with urology at his next visit with them.

## 2023-07-01 NOTE — Telephone Encounter (Signed)
 Pharmacy Patient Advocate Encounter   Received notification from CoverMyMeds that prior authorization for Colchicine  0.6MG  capsules  is required/requested.   Insurance verification completed.   The patient is insured through [employer] [facility] .   Per test claim:  COLCHICINE  TABLETS is preferred by the insurance.  IF SUGGESTED MEDICATION IS APPROPRIATE, PLEASE SEND IN A NEW RX AND DISCONTINUE THIS ONE.   PLEASE BE ADVISED PER TEST BILLING RECEIVE PAID CLAIM  COPAY OF $10.00 TABLETS SEE BELOW

## 2023-07-01 NOTE — Telephone Encounter (Signed)
 I was only informed that there was a critical.  Per protocol since before 5 PM I requested that PCP be contacted.  I was not informed of the actual value.  In looking at documentation after the fact patient is on testosterone  therapy and the prescribing provider we will need to adjust this-will defer to PCP for tomorrow as nothing can acutely be done

## 2023-07-01 NOTE — Assessment & Plan Note (Signed)
 Follows with urology.  On testosterone  injections twice weekly.  Will check labs today as he typically has a hard time getting this done at his urologist office though will defer further management to urology.

## 2023-07-01 NOTE — Progress Notes (Signed)
   Clinton West is a 58 y.o. male who presents today for an office visit.  Assessment/Plan:  New/Acute Problems: Left lower quadrant pain Symptoms have resolved since yesterday.  No other red flag signs or symptoms.  Reassuring exam.  Unclear etiology however may have been mild diverticular flare.  He will let us  know if he has any recurrence.  Do not need to pursue any further workup at this point.  Chronic Problems Addressed Today: Attention deficit hyperactivity disorder (ADHD) Doing well with current regimen Adderall IR 25 mg twice daily.  Refill was sent in earlier today.  Tolerating well.  No significant side effects.  Medications help with ability stay focused and on task.  Type 2 diabetes mellitus with hyperglycemia, without long-term current use of insulin (HCC) Not currently on any medications.  Check A1c.  BPH (benign prostatic hyperplasia) On Flomax  0.4 mg daily.  Follows with urology.  Still having some occasional symptoms with this.  Advised him to discuss with urology at his next visit with him.  Low testosterone  Follows with urology.  On testosterone  injections twice weekly.  Will check labs today as he typically has a hard time getting this done at his urologist office though will defer further management to urology.  Erectile dysfunction On Cialis  5 to 10 mg daily as needed.  It is okay for him to increase to 15 to 20 mg daily as needed as well.  He can also discuss with urology at his next visit with them.     Subjective:  HPI:  See assessment / plan for status of chronic conditions. He had sharp LLQ abdominal pain 2 days ago Woke him up from sleep. Felt like someone was stabbing in his abdomen. Woke up the next day and it resolved. No constipation or diarrhea. No nausea or vomiting.  Feels back to his baseline health now.       Objective:  Physical Exam: BP 119/73   Pulse 92   Temp 97.7 F (36.5 C) (Temporal)   Ht 6' 2 (1.88 m)   Wt 283 lb 12.8 oz  (128.7 kg)   SpO2 95%   BMI 36.44 kg/m   Gen: No acute distress, resting comfortably CV: Regular rate and rhythm with no murmurs appreciated Pulm: Normal work of breathing, clear to auscultation bilaterally with no crackles, wheezes, or rhonchi Abdomen: Soft, nontender, nondistended.  Bowel sounds present. Neuro: Grossly normal, moves all extremities Psych: Normal affect and thought content      Milli Woolridge M. Kennyth, MD 07/01/2023 1:25 PM

## 2023-07-01 NOTE — Telephone Encounter (Signed)
 Pharmacy is requesting Colchine tablets instead of the capsules due to insurance

## 2023-07-01 NOTE — Assessment & Plan Note (Signed)
Not currently on any medications.  Check A1c. 

## 2023-07-01 NOTE — Patient Instructions (Signed)
 It was very nice to see you today!  We refilled your medications.  Let us  know if the abdominal pain comes back.  Will check blood work today.  I will see you back in 3 months.  Please come back sooner if needed.  Return in about 3 months (around 10/01/2023).   Take care, Dr Kennyth  PLEASE NOTE:  If you had any lab tests, please let us  know if you have not heard back within a few days. You may see your results on mychart before we have a chance to review them but we will give you a call once they are reviewed by us .   If we ordered any referrals today, please let us  know if you have not heard from their office within the next week.   If you had any urgent prescriptions sent in today, please check with the pharmacy within an hour of our visit to make sure the prescription was transmitted appropriately.   Please try these tips to maintain a healthy lifestyle:  Eat at least 3 REAL meals and 1-2 snacks per day.  Aim for no more than 5 hours between eating.  If you eat breakfast, please do so within one hour of getting up.   Each meal should contain half fruits/vegetables, one quarter protein, and one quarter carbs (no bigger than a computer mouse)  Cut down on sweet beverages. This includes juice, soda, and sweet tea.   Drink at least 1 glass of water with each meal and aim for at least 8 glasses per day  Exercise at least 150 minutes every week.

## 2023-07-02 ENCOUNTER — Other Ambulatory Visit: Payer: Self-pay | Admitting: *Deleted

## 2023-07-02 ENCOUNTER — Ambulatory Visit: Payer: Self-pay | Admitting: Family Medicine

## 2023-07-02 DIAGNOSIS — E785 Hyperlipidemia, unspecified: Secondary | ICD-10-CM | POA: Insufficient documentation

## 2023-07-02 MED ORDER — COLCHICINE 0.6 MG PO TABS: ORAL_TABLET | ORAL | 1 refills | Status: AC

## 2023-07-02 NOTE — Addendum Note (Signed)
 Addended by: IDA ELORA HERO on: 07/02/2023 08:53 AM   Modules accepted: Orders

## 2023-07-02 NOTE — Telephone Encounter (Signed)
New prescription send to pharmacy.  

## 2023-07-02 NOTE — Telephone Encounter (Signed)
 Ok with me. Please place any necessary orders.

## 2023-07-02 NOTE — Progress Notes (Signed)
 It looks like his testosterone  is a bit too high.  He can discuss this with urology at his upcoming appointment.  He probably needs to decrease the dose somewhat.  His hemoglobin is also elevated.  This is likely due to his increased testosterone .  This should come down if he decreases the dose of testosterone  however we should recheck this again in a couple of months.  His blood sugar is elevated compared to last time.  Recommend he restart metformin  500 mg daily if he is agreeable.  We should recheck this in 3 months.  His cholesterol is also up a bit.  He would benefit from starting cholesterol medication to lower his numbers and reduce risk of heart attack and stroke.  Please send in Lipitor 10 mg daily if he is agreeable to start.  We should recheck in 3 to 6 months.  The rest of his labs are all stable.

## 2023-07-10 NOTE — Telephone Encounter (Signed)
 I appreciate the response.  I think it is a good idea to cut back on the sugar and restart the metformin .  We can recheck this again in a few months.  We can recheck his cholesterol when he comes back in in a few months as well.

## 2023-07-17 ENCOUNTER — Encounter (HOSPITAL_BASED_OUTPATIENT_CLINIC_OR_DEPARTMENT_OTHER): Payer: Self-pay | Admitting: Orthopaedic Surgery

## 2023-07-18 ENCOUNTER — Encounter (HOSPITAL_BASED_OUTPATIENT_CLINIC_OR_DEPARTMENT_OTHER)
Admission: RE | Admit: 2023-07-18 | Discharge: 2023-07-18 | Disposition: A | Discharge status: Home / Self Care | Source: Ambulatory Visit | Attending: Orthopaedic Surgery | Admitting: Orthopaedic Surgery

## 2023-07-18 DIAGNOSIS — Z01818 Encounter for other preprocedural examination: Secondary | ICD-10-CM | POA: Insufficient documentation

## 2023-07-18 LAB — SURGICAL PCR SCREEN
MRSA, PCR: NEGATIVE
Staphylococcus aureus: NEGATIVE

## 2023-07-18 NOTE — Progress Notes (Signed)
 Surgical soap given with instructions, pt verbalized understanding. Benzoyl peroxide gel given with instructions, pt verbalized understanding.

## 2023-07-22 ENCOUNTER — Other Ambulatory Visit (HOSPITAL_COMMUNITY): Payer: Self-pay

## 2023-07-23 NOTE — Discharge Instructions (Signed)
 Bonner Hair MD, MPH Aleck Stalling, PA-C [facility] [address] [phone] (tel)   [phone] (fax)   POST-OPERATIVE INSTRUCTIONS - TOTAL SHOULDER REPLACEMENT  Next tylenol  dose after 6:42pm   WOUND CARE You may leave the operative dressing in place until your follow-up appointment. KEEP THE INCISIONS CLEAN AND DRY. There may be a small amount of fluid/bleeding leaking at the surgical site. This is normal after surgery.  If it fills with liquid or blood please call us  immediately to change it for you. Use the provided ice machine or Ice packs as often as possible for the first 3-4 days, then as needed for pain relief.   Keep a layer of cloth or a shirt between your skin and the cooling unit to prevent frost bite as it can get very cold.  SHOWERING: - You may shower on Post-Op Day #2.  - The dressing is water resistant but do not scrub it as it may start to peel up.   - You may remove the sling for showering - Gently pat the area dry.  - Do not soak the shoulder in water.  - Do not go swimming in the pool or ocean until your incision has completely healed (about 4-6 weeks after surgery) - KEEP THE INCISIONS CLEAN AND DRY.  EXERCISES Wear the sling at all times  You may remove the sling for showering, but keep the arm across the chest or in a secondary sling.    Accidental/Purposeful External Rotation and shoulder flexion (reaching behind you) is to be avoided at all costs for the first month. It is ok to come out of your sling if your are sitting and have assistance for eating.   Do not lift anything heavier than 1 pound until we discuss it further in clinic.  It is normal for your fingers/hand to become more swollen after surgery and discolored from bruising.   This will resolve over the first few weeks usually after surgery. Please continue to ambulate and do not stay sitting or lying for too long.  Perform foot and wrist pumps to assist  in circulation.  PHYSICAL THERAPY - You will begin physical therapy soon after surgery (unless otherwise specified) - Please call to set up an appointment, if you do not already have one  - Let our office if there are any issues with scheduling your therapy  - You have a physical therapy appointment scheduled at [facility] (across the hall from our office) on 7/21   REGIONAL ANESTHESIA (NERVE BLOCKS) The anesthesia team may have performed a nerve block for you this is a great tool used to minimize pain.   The block may start wearing off overnight (between 8-24 hours postop) When the block wears off, your pain may go from nearly zero to the pain you would have had postop without the block. This is an abrupt transition but nothing dangerous is happening.   This can be a challenging period but utilize your as needed pain medications to try and manage this period. We suggest you use the pain medication the first night prior to going to bed, to ease this transition.  You may take an extra dose of narcotic when this happens if needed   POST-OP MEDICATIONS- Multimodal approach to pain control In general your pain will be controlled with a combination of substances.  Prescriptions unless otherwise discussed are electronically sent to your pharmacy.  This is a carefully made plan we use to minimize  narcotic use.     Celebrex  - Anti-inflammatory medication taken on a scheduled basis Acetaminophen  - Non-narcotic pain medicine taken on a scheduled basis  Oxycodone  - This is a strong narcotic, to be used only on an "as needed" basis for SEVERE pain. Aspirin  81mg  - This medicine is used to minimize the risk of blood clots after surgery. Zofran  -  take as needed for nausea  FOLLOW-UP If you develop a Fever (>101.5), Redness or Drainage from the surgical incision site, please call our office to arrange for an evaluation. Please call the office to schedule a follow-up appointment for a wound check, 7-10  days post-operatively.  IF YOU HAVE ANY QUESTIONS, PLEASE FEEL FREE TO CALL OUR OFFICE.  HELPFUL INFORMATION  Your arm will be in a sling following surgery. You will be in this sling for the next 4 weeks.   You may be more comfortable sleeping in a semi-seated position the first few nights following surgery.  Keep a pillow propped under the elbow and forearm for comfort.  If you have a recliner type of chair it might be beneficial.  If not that is fine too, but it would be helpful to sleep propped up with pillows behind your operated shoulder as well under your elbow and forearm.  This will reduce pulling on the suture lines.  When dressing, put your operative arm in the sleeve first.  When getting undressed, take your operative arm out last.  Loose fitting, button-down shirts are recommended.  In most states it is against the law to drive while your arm is in a sling. And certainly against the law to drive while taking narcotics.  You may return to work/school in the next couple of days when you feel up to it. Desk work and typing in the sling is fine.  We suggest you use the pain medication the first night prior to going to bed, in order to ease any pain when the anesthesia wears off. You should avoid taking pain medications on an empty stomach as it will make you nauseous.  You should wean off your narcotic medicines as soon as you are able.     Most patients will be off narcotics before their first postop appointment.   Do not drink alcoholic beverages or take illicit drugs when taking pain medications.  Pain medication may make you constipated.  Below are a few solutions to try in this order: Decrease the amount of pain medication if you aren't having pain. Drink lots of decaffeinated fluids. Drink prune juice and/or each dried prunes  If the first 3 don't work start with additional solutions Take Colace - an over-the-counter stool softener Take Senokot - an over-the-counter  laxative Take Miralax - a stronger over-the-counter laxative   Dental Antibiotics:  We require dental prophylaxis for 2 years after a shoulder replacement  Contact your surgeon for an antibiotic prescription, prior to your dental procedure.   For more information including helpful videos and documents visit our website:   [email or web address]    Information for Discharge Teaching: EXPAREL  (bupivacaine  liposome injectable suspension)   Pain relief is important to your recovery. The goal is to control your pain so you can move easier and return to your normal activities as soon as possible after your procedure. Your physician may use several types of medicines to manage pain, swelling, and more.  Your surgeon or anesthesiologist gave you EXPAREL (bupivacaine ) to help control your pain after surgery.  EXPAREL  is a local anesthetic designed to  release slowly over an extended period of time to provide pain relief by numbing the tissue around the surgical site. EXPAREL  is designed to release pain medication over time and can control pain for up to 72 hours. Depending on how you respond to EXPAREL , you may require less pain medication during your recovery. EXPAREL  can help reduce or eliminate the need for opioids during the first few days after surgery when pain relief is needed the most. EXPAREL  is not an opioid and is not addictive. It does not cause sleepiness or sedation.   Important! A teal colored band has been placed on your arm with the date, time and amount of EXPAREL  you have received. Please leave this armband in place for the full 96 hours following administration, and then you may remove the band. If you return to the hospital for any reason within 96 hours following the administration of EXPAREL , the armband provides important information that your health care providers to know, and alerts them that you have received this anesthetic.    Possible  side effects of EXPAREL : Temporary loss of sensation or ability to move in the area where medication was injected. Nausea, vomiting, constipation Rarely, numbness and tingling in your mouth or lips, lightheadedness, or anxiety may occur. Call your doctor right away if you think you may be experiencing any of these sensations, or if you have other questions regarding possible side effects.  Follow all other discharge instructions given to you by your surgeon or nurse. Eat a healthy diet and drink plenty of water or other fluids.

## 2023-07-23 NOTE — H&P (Signed)
 PREOPERATIVE H&P  Chief Complaint: OA RIGHRT SHOULDER  HPI: Clinton West is a 58 y.o. male who is scheduled for Procedure(s): ARTHROPLASTY, SHOULDER, TOTAL, REVERSE.   Patient has a past medical history significant for DM, HLD.   Patient has had right shoulder pain for many years.  He is not able to raise his arm overhead.  He is frustrated by the function of his right shoulder.   Symptoms are rated as moderate to severe, and have been worsening.  This is significantly impairing activities of daily living.    Please see clinic note for further details on this patient's care.    He has elected for surgical management.   Past Medical History:  Diagnosis Date   Arthritis    Depression    Diabetes mellitus without complication (HCC)    Glaucoma    Hyperlipidemia    Past Surgical History:  Procedure Laterality Date   ANKLE SURGERY     SHOULDER ARTHROSCOPY Left 2023   SPINAL FUSION  2012, 2013   Social History   Socioeconomic History   Marital status: Married    Spouse name: Not on file   Number of children: Not on file   Years of education: Not on file   Highest education level: Not on file  Occupational History   Not on file  Tobacco Use   Smoking status: Every Day    Current packs/day: 1.00    Types: Cigarettes   Smokeless tobacco: Never  Substance and Sexual Activity   Alcohol use: Yes    Comment: socially   Drug use: No   Sexual activity: Not on file  Other Topics Concern   Not on file  Social History Narrative   Not on file   Social Drivers of Health   Financial Resource Strain: Not on file  Food Insecurity: Not on file  Transportation Needs: Not on file  Physical Activity: Not on file  Stress: Not on file  Social Connections: Unknown (05/21/2021)   Received [facility]   Social Network    Social Network: Not on file   Family History  Problem Relation Age of Onset   Diabetes Brother    Depression Brother    Hypertension  Brother    Diabetes Maternal Uncle    Diabetes Maternal Grandmother    Cancer Maternal Grandmother    Hearing loss Maternal Grandmother    Alcohol abuse Father    COPD Father    Depression Father    Hyperlipidemia Father    Depression Sister    Drug abuse Sister    Hearing loss Maternal Grandfather    Allergies  Allergen Reactions   Penicillins Hives    Breaks out in Skin Bumps all over skin   Other Other (See Comments)    Limes gives him a headache   Prior to Admission medications   Medication Sig Start Date End Date Taking? Authorizing Provider  amphetamine -dextroamphetamine  (ADDERALL) 10 MG tablet Take 1 tablet (10 mg total) by mouth 2 (two) times daily. 07/01/23  Yes Kennyth Worth HERO, MD  amphetamine -dextroamphetamine  (ADDERALL) 15 MG tablet Take 1 tablet by mouth 2 (two) times daily. 07/01/23  Yes Kennyth Worth HERO, MD  Cholecalciferol (VITAMIN D3) 125 MCG (5000 UT) CAPS Take 5,000 capsules by mouth daily.   Yes [provider]  Coenzyme Q10-Red Yeast Rice (CO Q-10 PLUS RED YEAST RICE PO) Take by mouth.   Yes [provider]  colchicine  0.6 MG tablet Day 1: Take  2 caps, then 1 cap an hour later. Day 2 and beyond: 1 cap daily 07/02/23  Yes Kennyth Worth HERO, MD  DEPO-TESTOSTERONE  200 MG/ML injection Inject 100 mg into the muscle. 2x a week 06/27/22  Yes [provider]  HYDROcodone-acetaminophen  (NORCO) 10-325 MG tablet Take 1 tablet by mouth every 6 (six) hours as needed.   Yes [provider]  loratadine (CLARITIN) 10 MG tablet Take 10 mg by mouth daily.   Yes [provider]  metFORMIN  (GLUCOPHAGE ) 500 MG tablet Take 1 tablet (500 mg total) by mouth 2 (two) times daily with a meal. 09/11/22  Yes Kennyth Worth HERO, MD  Multiple Vitamin (MULTIVITAMIN) tablet Take 1 tablet by mouth daily.   Yes [provider]  tadalafil  (CIALIS ) 10 MG tablet TAKE 1/2 TO 1 TABLET BY MOUTH DAILY AS NEEDED FOR ERECTILE DYSFUNCTION 07/01/23  Yes Kennyth Worth HERO, MD  tamsulosin  (FLOMAX ) 0.4 MG CAPS capsule Take 1 capsule (0.4 mg total) by mouth daily. 03/25/23  Yes Kennyth Worth HERO, MD  allopurinol (ZYLOPRIM) 100 MG tablet Take 100 mg by mouth once as needed.    [provider]    ROS: All other systems have been reviewed and were otherwise negative with the exception of those mentioned in the HPI and as above.  Physical Exam: General: Alert, no acute distress Cardiovascular: No pedal edema Respiratory: No cyanosis, no use of accessory musculature GI: No organomegaly, abdomen is soft and non-tender Skin: No lesions in the area of chief complaint Neurologic: Sensation intact distally Psychiatric: Patient is competent for consent with normal mood and affect Lymphatic: No axillary or cervical lymphadenopathy  MUSCULOSKELETAL:  Range of motion of the shoulder to about 30 degrees actively, passively to 150.  4-/5 supraspinatus strength.  Distal motor and sensory function are intact.  Internal rotation to T8.    Imaging: X-rays demonstrate an acromiohumeral interval of 3 millimeters.  Significant superior migration of the humeral head.  No obvious arthritis.    BMI: Body mass index is 35.95 kg/m.  Lab Results  Component Value Date   ALBUMIN 4.5 07/01/2023   Diabetes:   Patient has a diagnosis of diabetes,  Lab Results  Component Value Date   HGBA1C 8.0 (H) 07/01/2023   Smoking Status: Social History   Tobacco Use  Smoking Status Every Day   Current packs/day: 1.00   Types: Cigarettes  Smokeless Tobacco Never   Ready to quit: Not Answered Counseling given: Not Answered  The patient has participated in a 4-week cessation program.           Assessment: OA RIGHRT SHOULDER  Plan: Plan for Procedure(s): ARTHROPLASTY, SHOULDER, TOTAL, REVERSE  The risks benefits and alternatives were discussed with the patient including but not limited to the risks of nonoperative treatment, versus surgical intervention including  infection, bleeding, nerve injury,  blood clots, cardiopulmonary complications, morbidity, mortality, among others, and they were willing to proceed.   We additionally specifically discussed risks of axillary nerve injury, infection, periprosthetic fracture, continued pain and longevity of implants prior to beginning procedure.    Patient will be closely monitored in PACU for medical stabilization and pain control. If found stable in PACU, patient may be discharged home with outpatient follow-up. If any concerns regarding patient's stabilization patient will be admitted for observation after surgery. The patient is planning to be discharged home with outpatient PT.   The patient acknowledged the explanation, agreed to proceed with the plan and consent was signed.  Operative Plan: Right reverse total shoulder arthroplasty Discharge Medications: standard DVT Prophylaxis: aspirin  Physical Therapy: outpatient PT Special Discharge needs: Sling (should bring with him). IceMan   Aleck LOISE Stalling, PA-C  07/23/2023 12:47 PM

## 2023-07-24 ENCOUNTER — Ambulatory Visit (HOSPITAL_BASED_OUTPATIENT_CLINIC_OR_DEPARTMENT_OTHER): Admitting: Anesthesiology

## 2023-07-24 ENCOUNTER — Encounter (HOSPITAL_BASED_OUTPATIENT_CLINIC_OR_DEPARTMENT_OTHER): Payer: Self-pay | Admitting: Orthopaedic Surgery

## 2023-07-24 ENCOUNTER — Encounter (HOSPITAL_BASED_OUTPATIENT_CLINIC_OR_DEPARTMENT_OTHER)
Admission: RE | Disposition: A | Discharge status: Home / Self Care | Payer: Self-pay | Source: Home / Self Care | Attending: Orthopaedic Surgery

## 2023-07-24 ENCOUNTER — Ambulatory Visit (HOSPITAL_COMMUNITY)

## 2023-07-24 ENCOUNTER — Other Ambulatory Visit: Payer: Self-pay

## 2023-07-24 ENCOUNTER — Ambulatory Visit (HOSPITAL_BASED_OUTPATIENT_CLINIC_OR_DEPARTMENT_OTHER)
Admission: RE | Admit: 2023-07-24 | Discharge: 2023-07-24 | Disposition: A | Discharge status: Home / Self Care | Attending: Orthopaedic Surgery | Admitting: Orthopaedic Surgery

## 2023-07-24 DIAGNOSIS — G4730 Sleep apnea, unspecified: Secondary | ICD-10-CM | POA: Insufficient documentation

## 2023-07-24 DIAGNOSIS — E785 Hyperlipidemia, unspecified: Secondary | ICD-10-CM | POA: Insufficient documentation

## 2023-07-24 DIAGNOSIS — Z7984 Long term (current) use of oral hypoglycemic drugs: Secondary | ICD-10-CM | POA: Diagnosis not present

## 2023-07-24 DIAGNOSIS — Z01818 Encounter for other preprocedural examination: Secondary | ICD-10-CM

## 2023-07-24 DIAGNOSIS — M19011 Primary osteoarthritis, right shoulder: Secondary | ICD-10-CM | POA: Diagnosis present

## 2023-07-24 DIAGNOSIS — E119 Type 2 diabetes mellitus without complications: Secondary | ICD-10-CM | POA: Insufficient documentation

## 2023-07-24 HISTORY — PX: REVERSE SHOULDER ARTHROPLASTY: SHX5054

## 2023-07-24 LAB — GLUCOSE, CAPILLARY
Glucose-Capillary: 156 mg/dL — ABNORMAL HIGH (ref 70–99)
Glucose-Capillary: 232 mg/dL — ABNORMAL HIGH (ref 70–99)

## 2023-07-24 SURGERY — ARTHROPLASTY, SHOULDER, TOTAL, REVERSE
Anesthesia: Regional | Site: Shoulder | Laterality: Right

## 2023-07-24 MED ORDER — OXYCODONE HCL 5 MG/5ML PO SOLN: 5.0000 mg | Freq: Once | ORAL | Status: AC | PRN

## 2023-07-24 MED ORDER — VASOPRESSIN 20 UNIT/ML IV SOLN
INTRAVENOUS | Status: DC | PRN
Start: 2023-07-24 — End: 2023-07-24
  Administered 2023-07-24: 1 [IU] via INTRAVENOUS
  Administered 2023-07-24: .25 [IU] via INTRAVENOUS
  Administered 2023-07-24: 1 [IU] via INTRAVENOUS

## 2023-07-24 MED ORDER — DEXMEDETOMIDINE HCL IN NACL 80 MCG/20ML IV SOLN
INTRAVENOUS | Status: DC | PRN
  Administered 2023-07-24 (×2): 4 ug via INTRAVENOUS

## 2023-07-24 MED ORDER — TRANEXAMIC ACID-NACL 1000-0.7 MG/100ML-% IV SOLN
INTRAVENOUS | Status: AC
  Filled 2023-07-24: qty 100

## 2023-07-24 MED ORDER — ACETAMINOPHEN 500 MG PO TABS
ORAL_TABLET | ORAL | Status: AC
  Filled 2023-07-24: qty 2

## 2023-07-24 MED ORDER — DEXAMETHASONE SODIUM PHOSPHATE 10 MG/ML IJ SOLN
INTRAMUSCULAR | Status: DC | PRN
  Administered 2023-07-24: 10 mg via INTRAVENOUS

## 2023-07-24 MED ORDER — FENTANYL CITRATE (PF) 100 MCG/2ML IJ SOLN
INTRAMUSCULAR | Status: AC
  Filled 2023-07-24: qty 2

## 2023-07-24 MED ORDER — FENTANYL CITRATE (PF) 100 MCG/2ML IJ SOLN
INTRAMUSCULAR | Status: DC | PRN
  Administered 2023-07-24: 100 ug via INTRAVENOUS

## 2023-07-24 MED ORDER — TRANEXAMIC ACID-NACL 1000-0.7 MG/100ML-% IV SOLN
1000.0000 mg | INTRAVENOUS | Status: AC
  Administered 2023-07-24: 1000 mg via INTRAVENOUS

## 2023-07-24 MED ORDER — ONDANSETRON HCL 4 MG/2ML IJ SOLN
INTRAMUSCULAR | Status: DC | PRN
  Administered 2023-07-24: 4 mg via INTRAVENOUS

## 2023-07-24 MED ORDER — FENTANYL CITRATE (PF) 100 MCG/2ML IJ SOLN
100.0000 ug | Freq: Once | INTRAMUSCULAR | Status: AC
  Administered 2023-07-24: 100 ug via INTRAVENOUS

## 2023-07-24 MED ORDER — OXYCODONE HCL 5 MG PO TABS
5.0000 mg | ORAL_TABLET | Freq: Once | ORAL | Status: AC | PRN
  Administered 2023-07-24: 5 mg via ORAL

## 2023-07-24 MED ORDER — 0.9 % SODIUM CHLORIDE (POUR BTL) OPTIME
TOPICAL | Status: DC | PRN
  Administered 2023-07-24: 1000 mL

## 2023-07-24 MED ORDER — ROCURONIUM BROMIDE 10 MG/ML (PF) SYRINGE
PREFILLED_SYRINGE | INTRAVENOUS | Status: DC | PRN
  Administered 2023-07-24: 10 mg via INTRAVENOUS
  Administered 2023-07-24: 30 mg via INTRAVENOUS

## 2023-07-24 MED ORDER — AMISULPRIDE (ANTIEMETIC) 5 MG/2ML IV SOLN: 10.0000 mg | Freq: Once | INTRAVENOUS | Status: DC | PRN

## 2023-07-24 MED ORDER — ASPIRIN 81 MG PO CHEW: 81.0000 mg | CHEWABLE_TABLET | Freq: Two times a day (BID) | ORAL | 0 refills | Status: AC

## 2023-07-24 MED ORDER — VANCOMYCIN HCL 1000 MG IV SOLR
INTRAVENOUS | Status: AC
  Filled 2023-07-24: qty 20

## 2023-07-24 MED ORDER — ACETAMINOPHEN 500 MG PO TABS: 1000.0000 mg | ORAL_TABLET | Freq: Three times a day (TID) | ORAL | 0 refills | Status: AC

## 2023-07-24 MED ORDER — BUPIVACAINE LIPOSOME 1.3 % IJ SUSP
INTRAMUSCULAR | Status: DC | PRN
Start: 2023-07-24 — End: 2023-07-24
  Administered 2023-07-24: 10 mL via PERINEURAL

## 2023-07-24 MED ORDER — GABAPENTIN 300 MG PO CAPS
300.0000 mg | ORAL_CAPSULE | Freq: Once | ORAL | Status: AC
  Administered 2023-07-24: 300 mg via ORAL

## 2023-07-24 MED ORDER — PHENYLEPHRINE HCL (PRESSORS) 10 MG/ML IV SOLN
INTRAVENOUS | Status: AC
  Filled 2023-07-24: qty 1

## 2023-07-24 MED ORDER — PHENYLEPHRINE HCL-NACL 20-0.9 MG/250ML-% IV SOLN
INTRAVENOUS | Status: DC | PRN
  Administered 2023-07-24: 50 ug/min via INTRAVENOUS

## 2023-07-24 MED ORDER — EPHEDRINE SULFATE (PRESSORS) 50 MG/ML IJ SOLN
INTRAMUSCULAR | Status: DC | PRN
  Administered 2023-07-24: 5 mg via INTRAVENOUS
  Administered 2023-07-24: 2.5 mg via INTRAVENOUS

## 2023-07-24 MED ORDER — CEFAZOLIN SODIUM-DEXTROSE 3-4 GM/150ML-% IV SOLN
INTRAVENOUS | Status: AC
  Filled 2023-07-24: qty 150

## 2023-07-24 MED ORDER — MIDAZOLAM HCL 2 MG/2ML IJ SOLN
2.0000 mg | Freq: Once | INTRAMUSCULAR | Status: AC
  Administered 2023-07-24: 2 mg via INTRAVENOUS

## 2023-07-24 MED ORDER — MIDAZOLAM HCL 2 MG/2ML IJ SOLN
INTRAMUSCULAR | Status: AC
  Filled 2023-07-24: qty 2

## 2023-07-24 MED ORDER — VANCOMYCIN HCL 1000 MG IV SOLR
INTRAVENOUS | Status: DC | PRN
  Administered 2023-07-24: 1000 mg via TOPICAL

## 2023-07-24 MED ORDER — OXYCODONE HCL 5 MG PO TABS
ORAL_TABLET | ORAL | Status: AC
  Filled 2023-07-24: qty 1

## 2023-07-24 MED ORDER — GABAPENTIN 300 MG PO CAPS
ORAL_CAPSULE | ORAL | Status: AC
  Filled 2023-07-24: qty 1

## 2023-07-24 MED ORDER — BUPIVACAINE HCL (PF) 0.5 % IJ SOLN
INTRAMUSCULAR | Status: DC | PRN
  Administered 2023-07-24: 15 mL via PERINEURAL

## 2023-07-24 MED ORDER — PHENYLEPHRINE 80 MCG/ML (10ML) SYRINGE FOR IV PUSH (FOR BLOOD PRESSURE SUPPORT)
PREFILLED_SYRINGE | INTRAVENOUS | Status: DC | PRN
  Administered 2023-07-24: 120 ug via INTRAVENOUS
  Administered 2023-07-24 (×3): 80 ug via INTRAVENOUS
  Administered 2023-07-24 (×2): 160 ug via INTRAVENOUS
  Administered 2023-07-24 (×2): 80 ug via INTRAVENOUS
  Administered 2023-07-24: 100 ug via INTRAVENOUS
  Administered 2023-07-24 (×5): 80 ug via INTRAVENOUS
  Administered 2023-07-24: 100 ug via INTRAVENOUS
  Administered 2023-07-24 (×3): 80 ug via INTRAVENOUS
  Administered 2023-07-24: 120 ug via INTRAVENOUS

## 2023-07-24 MED ORDER — CEFAZOLIN SODIUM-DEXTROSE 3-4 GM/150ML-% IV SOLN
3.0000 g | INTRAVENOUS | Status: AC
  Administered 2023-07-24: 3 g via INTRAVENOUS

## 2023-07-24 MED ORDER — OXYCODONE HCL 5 MG PO TABS: ORAL_TABLET | ORAL | 0 refills | Status: AC

## 2023-07-24 MED ORDER — LACTATED RINGERS IV SOLN: INTRAVENOUS | Status: DC

## 2023-07-24 MED ORDER — VASOPRESSIN 20 UNIT/ML IV SOLN
INTRAVENOUS | Status: AC
  Filled 2023-07-24: qty 1

## 2023-07-24 MED ORDER — FENTANYL CITRATE (PF) 100 MCG/2ML IJ SOLN
25.0000 ug | INTRAMUSCULAR | Status: DC | PRN
  Administered 2023-07-24 (×2): 25 ug via INTRAVENOUS

## 2023-07-24 MED ORDER — LIDOCAINE 2% (20 MG/ML) 5 ML SYRINGE
INTRAMUSCULAR | Status: DC | PRN
  Administered 2023-07-24: 20 mg via INTRAVENOUS

## 2023-07-24 MED ORDER — FENTANYL CITRATE (PF) 100 MCG/2ML IJ SOLN
INTRAMUSCULAR | Status: AC
Start: 2023-07-24 — End: 2023-07-24
  Filled 2023-07-24: qty 2

## 2023-07-24 MED ORDER — PROPOFOL 10 MG/ML IV BOLUS
INTRAVENOUS | Status: DC | PRN
Start: 2023-07-24 — End: 2023-07-24
  Administered 2023-07-24: 200 mg via INTRAVENOUS
  Administered 2023-07-24: 20 mg via INTRAVENOUS

## 2023-07-24 MED ORDER — ACETAMINOPHEN 500 MG PO TABS
1000.0000 mg | ORAL_TABLET | Freq: Once | ORAL | Status: AC
  Administered 2023-07-24: 1000 mg via ORAL

## 2023-07-24 MED ORDER — ONDANSETRON HCL 4 MG PO TABS: 4.0000 mg | ORAL_TABLET | Freq: Three times a day (TID) | ORAL | 0 refills | Status: AC | PRN

## 2023-07-24 MED ORDER — SUGAMMADEX SODIUM 200 MG/2ML IV SOLN
INTRAVENOUS | Status: DC | PRN
  Administered 2023-07-24: 200 mg via INTRAVENOUS

## 2023-07-24 MED ORDER — CELECOXIB 100 MG PO CAPS: 100.0000 mg | ORAL_CAPSULE | Freq: Two times a day (BID) | ORAL | 0 refills | Status: AC

## 2023-07-24 SURGICAL SUPPLY — 56 items
BASEPLATE SHOULDER FW 15D 29 (Joint) IMPLANT
BIT DRILL 3.2 PERIPHERAL SCREW (BIT) IMPLANT
BLADE SAW SGTL 73X25 THK (BLADE) ×1 IMPLANT
BLADE SURG 10 STRL SS (BLADE) IMPLANT
BLADE SURG 15 STRL LF DISP TIS (BLADE) IMPLANT
BRUSH SCRUB EZ PLAIN DRY (MISCELLANEOUS) ×1 IMPLANT
CHLORAPREP W/TINT 26 (MISCELLANEOUS) ×1 IMPLANT
CLSR STERI-STRIP ANTIMIC 1/2X4 (GAUZE/BANDAGES/DRESSINGS) ×1 IMPLANT
COOLER ICEMAN CLASSIC (MISCELLANEOUS) ×1 IMPLANT
COVER BACK TABLE 60X90IN (DRAPES) ×1 IMPLANT
COVER MAYO STAND STRL (DRAPES) ×1 IMPLANT
CUP HUM REV SHLD 3/4 39 +3 (Cup) IMPLANT
DRAPE IMP U-DRAPE 54X76 (DRAPES) IMPLANT
DRAPE INCISE IOBAN 66X45 STRL (DRAPES) ×1 IMPLANT
DRAPE POUCH INSTRU U-SHP 10X18 (DRAPES) ×1 IMPLANT
DRAPE U-SHAPE 76X120 STRL (DRAPES) ×2 IMPLANT
DRSG AQUACEL AG ADV 3.5X 6 (GAUZE/BANDAGES/DRESSINGS) ×1 IMPLANT
ELECTRODE BLDE 4.0 EZ CLN MEGD (MISCELLANEOUS) ×1 IMPLANT
ELECTRODE REM PT RTRN 9FT ADLT (ELECTROSURGICAL) ×1 IMPLANT
FACESHIELD WRAPAROUND (MASK) ×2 IMPLANT
FACESHIELD WRAPAROUND OR TEAM (MASK) ×2 IMPLANT
GAUZE XEROFORM 1X8 LF (GAUZE/BANDAGES/DRESSINGS) IMPLANT
GLENOSPHERE STD 39 (Joint) IMPLANT
GLOVE BIO SURGEON STRL SZ 6.5 (GLOVE) ×2 IMPLANT
GLOVE BIOGEL PI IND STRL 6.5 (GLOVE) ×1 IMPLANT
GLOVE BIOGEL PI IND STRL 8 (GLOVE) ×1 IMPLANT
GLOVE ECLIPSE 8.0 STRL XLNG CF (GLOVE) ×2 IMPLANT
GOWN STRL REUS W/ TWL LRG LVL3 (GOWN DISPOSABLE) ×2 IMPLANT
GOWN STRL REUS W/TWL XL LVL3 (GOWN DISPOSABLE) ×1 IMPLANT
GUIDEWIRE GLENOID 2.5X220 (WIRE) IMPLANT
KIT STABILIZATION SHOULDER (MISCELLANEOUS) ×1 IMPLANT
MANIFOLD NEPTUNE II (INSTRUMENTS) ×1 IMPLANT
PACK BASIN DAY SURGERY FS (CUSTOM PROCEDURE TRAY) ×1 IMPLANT
PACK SHOULDER (CUSTOM PROCEDURE TRAY) ×1 IMPLANT
PAD COLD SHLDR WRAP-ON (PAD) ×1 IMPLANT
PIN GUIDE 3X75 SHOULDER (PIN) IMPLANT
RESTRAINT HEAD UNIVERSAL NS (MISCELLANEOUS) ×1 IMPLANT
SCREW 5.0X38 SMALL F/PERFORM (Screw) IMPLANT
SCREW 5.5X22 (Screw) IMPLANT
SCREW CENTRAL THREAD 6.5X45 (Screw) IMPLANT
SCREW PERIPHERAL 5.0X34 (Screw) IMPLANT
SET HNDPC FAN SPRY TIP SCT (DISPOSABLE) ×1 IMPLANT
SHEET MEDIUM DRAPE 40X70 STRL (DRAPES) ×1 IMPLANT
SLEEVE SCD COMPRESS KNEE MED (STOCKING) ×1 IMPLANT
SPIKE FLUID TRANSFER (MISCELLANEOUS) IMPLANT
SPONGE T-LAP 18X18 ~~LOC~~+RFID (SPONGE) ×1 IMPLANT
STEM HUMERAL STD SHORT SZ3 (Joint) IMPLANT
SUT ETHIBOND 2 V 37 (SUTURE) ×1 IMPLANT
SUT ETHIBOND NAB CT1 #1 30IN (SUTURE) ×1 IMPLANT
SUT ETHILON 3 0 PS 1 (SUTURE) IMPLANT
SUT MNCRL AB 4-0 PS2 18 (SUTURE) ×1 IMPLANT
SUT VIC AB 0 CT1 27XBRD ANBCTR (SUTURE) IMPLANT
SUT VIC AB 3-0 SH 27X BRD (SUTURE) ×1 IMPLANT
SUTURE FIBERWR #5 38 CONV NDL (SUTURE) ×2 IMPLANT
TOWEL GREEN STERILE FF (TOWEL DISPOSABLE) ×3 IMPLANT
TUBE SUCTION HIGH CAP CLEAR NV (SUCTIONS) ×1 IMPLANT

## 2023-07-24 NOTE — Anesthesia Procedure Notes (Addendum)
 Procedure Name: Intubation Date/Time: 07/24/2023 7:43 AM  Performed by: Leotha Andrez DEL, CRNAPre-anesthesia Checklist: Patient identified, Emergency Drugs available, Suction available and Patient being monitored Patient Re-evaluated:Patient Re-evaluated prior to induction Oxygen Delivery Method: Circle System Utilized Preoxygenation: Pre-oxygenation with 100% oxygen Induction Type: IV induction Ventilation: Mask ventilation without difficulty Laryngoscope Size: Glidescope and 1 Grade View: Grade I Tube type: Oral Tube size: 7.0 mm Number of attempts: 1 Airway Equipment and Method: Video-laryngoscopy and Rigid stylet Placement Confirmation: ETT inserted through vocal cords under direct vision, positive ETCO2 and breath sounds checked- equal and bilateral Secured at: 23 cm Tube secured with: Tape Dental Injury: Teeth and Oropharynx as per pre-operative assessment  Difficulty Due To: Difficulty was anticipated and Difficult Airway- due to reduced neck mobility

## 2023-07-24 NOTE — Interval H&P Note (Signed)
 All questions answered, patient wants to proceed with procedure. ? ?

## 2023-07-24 NOTE — Anesthesia Preprocedure Evaluation (Addendum)
 Anesthesia Evaluation  Patient identified by MRN, date of birth, ID band Patient awake    Reviewed: Allergy & Precautions, NPO status , Patient's Chart, lab work & pertinent test results  Airway Mallampati: III  TM Distance: >3 FB Neck ROM: Full    Dental no notable dental hx. (+) Teeth Intact, Dental Advisory Given   Pulmonary sleep apnea , Current SmokerPatient did not abstain from smoking.   Pulmonary exam normal breath sounds clear to auscultation       Cardiovascular negative cardio ROS Normal cardiovascular exam Rhythm:Regular Rate:Normal     Neuro/Psych  PSYCHIATRIC DISORDERS  Depression    negative neurological ROS     GI/Hepatic negative GI ROS, Neg liver ROS,,,  Endo/Other  diabetes, Type 2, Oral Hypoglycemic Agents    Renal/GU negative Renal ROS  negative genitourinary   Musculoskeletal  (+) Arthritis ,    Abdominal   Peds  Hematology negative hematology ROS (+)   Anesthesia Other Findings   Reproductive/Obstetrics                              Anesthesia Physical Anesthesia Plan  ASA: 2  Anesthesia Plan: General and Regional   Post-op Pain Management: Regional block* and Tylenol  PO (pre-op)*   Induction: Intravenous  PONV Risk Score and Plan: 1 and Midazolam , Dexamethasone  and Ondansetron   Airway Management Planned: Oral ETT and Video Laryngoscope Planned  Additional Equipment:   Intra-op Plan:   Post-operative Plan: Extubation in OR  Informed Consent: I have reviewed the patients History and Physical, chart, labs and discussed the procedure including the risks, benefits and alternatives for the proposed anesthesia with the patient or authorized representative who has indicated his/her understanding and acceptance.     Dental advisory given  Plan Discussed with: CRNA  Anesthesia Plan Comments:          Anesthesia Quick Evaluation

## 2023-07-24 NOTE — Anesthesia Postprocedure Evaluation (Signed)
 Anesthesia Post Note  Patient: Clinton West  Procedure(s) Performed: ARTHROPLASTY, SHOULDER, TOTAL, REVERSE (Right: Shoulder)     Patient location during evaluation: PACU Anesthesia Type: Regional and General Level of consciousness: awake and alert Pain management: pain level controlled Vital Signs Assessment: post-procedure vital signs reviewed and stable Respiratory status: spontaneous breathing, nonlabored ventilation, respiratory function stable and patient connected to nasal cannula oxygen Cardiovascular status: blood pressure returned to baseline and stable Postop Assessment: no apparent nausea or vomiting Anesthetic complications: no   No notable events documented.  Last Vitals:  Vitals:   07/24/23 0935 07/24/23 0956  BP:  (!) 109/59  Pulse: 79 88  Resp: 18 18  Temp:  (!) 36.2 C  SpO2: 96% 94%    Last Pain:  Vitals:   07/24/23 0956  TempSrc: Temporal  PainSc:                  [name] L Raymar Joiner

## 2023-07-24 NOTE — Progress Notes (Signed)
Assisted Dr. Armond Hang with right, interscalene , ultrasound guided block. Side rails up, monitors on throughout procedure. See vital signs in flow sheet. Tolerated Procedure well.

## 2023-07-24 NOTE — Transfer of Care (Signed)
 Immediate Anesthesia Transfer of Care Note  Patient: Clinton West  Procedure(s) Performed: ARTHROPLASTY, SHOULDER, TOTAL, REVERSE (Right: Shoulder)  Patient Location: PACU  Anesthesia Type:General  Level of Consciousness: drowsy and patient cooperative  Airway & Oxygen Therapy: Patient Spontanous Breathing and Patient connected to face mask oxygen  Post-op Assessment: Report given to RN and Post -op Vital signs reviewed and stable  Post vital signs: Reviewed and stable  Last Vitals:  Vitals Value Taken Time  BP 104/62 07/24/23 09:00  Temp 36.6 C 07/24/23 09:00  Pulse 82 07/24/23 09:08  Resp 18 07/24/23 09:08  SpO2 100 % 07/24/23 09:08  Vitals shown include unfiled device data.  Last Pain:  Vitals:   07/24/23 0900  TempSrc:   PainSc: 0-No pain      Patients Stated Pain Goal: 5 (07/24/23 9361)  Complications: No notable events documented.

## 2023-07-24 NOTE — Op Note (Signed)
 Orthopaedic Surgery Operative Note (CSN: [id number])  Clinton West  27-Dec-1965 Date of Surgery: 07/24/2023   Diagnoses:  Right rotator cuff arthropathy  Procedure: Right reverse Total Shoulder Arthroplasty   Operative Finding Successful completion of planned procedure.  Post-operative plan: The patient will be NWB in sling.  The patient will be will be discharged from PACU if continues to be stable as was plan prior to surgery.  DVT prophylaxis Aspirin  81 mg twice daily for 6 weeks.  Pain control with PRN pain medication preferring oral medicines.  Follow up plan will be scheduled in approximately 7 days for incision check and XR.  Physical therapy to start immediately.  Implants: Perform humeral size 3 standard stem, 39+3 polyethylene, 29 full wedge baseplate, 45 center screw with 4 peripheral screws  Post-Op Diagnosis: Same Surgeons:Primary: Cristy Bonner DASEN, MD Assistants:Caroline McBane, PA-C Location: [facility] OR ROOM 1 Anesthesia: General with Exparel  Interscalene Antibiotics: Ancef  2g preop, Vancomycin  1000mg  locally Tourniquet time: None Estimated Blood Loss: 100 Complications: None Specimens: None Implants: Implant Name Type Inv. Item Serial No. Manufacturer Lot No. LRB No. Used Action  GLENOSPHERE STD 39 - [id number] Joint GLENOSPHERE STD 39 [id number] [employer]  Right 1 Implanted  BASEPLATE SHOULDER FW 15D 29 - [id number] Joint BASEPLATE SHOULDER FW 15D 29 [id number] [employer]  Right 1 Implanted  CUP HUM REV SHLD 3/4 39 +3 - [id number] Cup CUP HUM REV SHLD 3/4 39 +3 [id number] [employer]  Right 1 Implanted  STEM HUMERAL STD SHORT SZ3 - [id number] Joint STEM HUMERAL STD SHORT SZ3 [id number] [employer]  Right 1 Implanted  SCREW CENTRAL THREAD 6.5X45 - [id number] Screw SCREW CENTRAL THREAD 6.5X45  [employer]  Right 1 Implanted  SCREW 5.0X38 SMALL F/PERFORM - [id number] Screw SCREW 5.0X38 SMALL F/PERFORM  [employer]  Right 1 Implanted  SCREW 5.5X22 - [id number] Screw  SCREW 5.5X22  [employer]  Right 2 Implanted  SCREW PERIPHERAL 5.0X34 - [id number] Screw SCREW PERIPHERAL 5.0X34  [employer]  Right 1 Implanted    Indications for Surgery:   Clinton West is a 58 y.o. male with rotator cuff arthropathy.  Benefits and risks of operative and nonoperative management were discussed prior to surgery with patient/guardian(s) and informed consent form was completed.  Infection and need for further surgery were discussed as was prosthetic stability and cuff issues.  We additionally specifically discussed risks of axillary nerve injury, infection, periprosthetic fracture, continued pain and longevity of implants prior to beginning procedure.      Procedure:   The patient was identified in the preoperative holding area where the surgical site was marked. Block placed by anesthesia with exparel .  The patient was taken to the OR where a procedural timeout was called and the above noted anesthesia was induced.  The patient was positioned beachchair on allen table with spider arm positioner.  Preoperative antibiotics were dosed.  The patient's right shoulder was prepped and draped in the usual sterile fashion.  A second preoperative timeout was called.       Standard deltopectoral approach was performed with a #10 blade. We dissected down to the subcutaneous tissues and the cephalic vein was taken laterally with the deltoid. Clavipectoral fascia was incised in line with the incision. Deep retractors were placed. The long of the biceps tendon was identified and there was significant tenosynovitis present.  Tenodesis was performed to the pectoralis tendon with #2 Ethibond. The remaining biceps was followed up into the rotator interval where it was released.  The subscapularis was taken down in a full thickness layer with capsule along the humeral neck extending inferiorly around the humeral head. We continued releasing the capsule directly off of the osteophytes inferiorly all  the way around the corner. This allowed us  to dislocate the humeral head.   The humeral head had evidence of severe osteoarthritic wear with full-thickness cartilage loss and exposed subchondral bone. There was significant flattening of the humeral head.   The rotator cuff was carefully examined and noted to be irreperably torn.  The decision was confirmed that a reverse total shoulder was indicated for this patient.  There were osteophytes along the inferior humeral neck. The osteophytes were removed with an osteotome and a rongeur.  Osteophytes were removed with a rongeur and an osteotome and the anatomic neck was well visualized.     A humeral cutting guide was used extra medullary with a pin to help control version. The version was set at 20 of retroversion. Humeral osteotomy was performed with an oscillating saw. The head fragment was passed off the back table.  A cut protector plate was placed.  The subscapularis was again identified and immediately we took care to palpate the axillary nerve anteriorly and verify its position with gentle palpation as well as the tug test.  We then released the SGHL with bovie cautery prior to placing a curved mayo at the junction of the anterior glenoid well above the axillary nerve and bluntly dissecting the subscapularis from the capsule.  We then carefully protected the axillary nerve as we gently released the inferior capsule to fully mobilize the subscapularis.  An anterior deltoid retractor was then placed as well as a small Hohmann retractor superiorly.   The glenoid was inspected and had evidence of severe osteoarthritic wear with full-thickness cartilage loss and exposed subchondral bone.   The remaining labrum was removed circumferentially taking great care not to disrupt the posterior capsule.   At this point we felt based on blueprint templating that a full wedge augment was necessary.  We began by using a full wedge guide to place our center pin  as was templated.  We had good position of this pin and we proceeded with our starter center drill.  This allowed for us  to use the 15 degree full wedge reamer obtaining circumferential witness marks and good bone preparation for ingrowth.  At this point we proceeded with our center drill and had an intact vault.  We then drilled our center screw to a length of 45 mm.    We selected a 6.5 mm x 45 mm screw and the full wedge baseplate which was placed in the same orientation as our reaming.  We double checked that we had good apposition of the base plate to bone and then proceeded to place 3 locking screws and one nonlocking screw as is typical.   Next a 39+3 mm glenosphere was selected and impacted onto the baseplate. The center screw was tightened.  We turned attention back to the humeral side. The cut protector was removed.  We used the perform humeral sizing block to select the appropriate size which for this patient was a 3.  We then placed our center pin and reamed over it concentrically obtaining appropriate inset.  We then used our lateralizing chisel to prepare the lateral aspect of the humerus.  At that point we selected the appropriate implant trialing a 3.  Using this trial implant we trialed multiple polyethylene sizes settling on a +3 which  provided good stability and range of motion without excess soft tissue tension. The offset was dialed in to match the normal anatomy. The shoulder was trialed.  There was good ROM in all planes and the shoulder was stable with no inferior translation.  The real humeral implants were opened after again confirming sizes.  The trial was removed. #5 Fiberwire x4 sutures passed through the humeral neck for subscap repair. The humeral component was press-fit obtaining a secure fit. The joint was reduced and thoroughly irrigated with pulsatile lavage. Subscap was repaired back with #5 Fiberwire sutures through bone tunnels. Hemostasis was obtained. The deltopectoral  interval was reapproximated with #1 Ethibond. The subcutaneous tissues were closed with 2-0 Vicryl and the skin was closed with running monocryl.    The wounds were cleaned and dried and an Aquacel dressing was placed. The drapes taken down. The arm was placed into sling with abduction pillow. Patient was awakened, extubated, and transferred to the recovery room in stable condition. There were no intraoperative complications. The sponge, needle, and attention counts were correct at the end of the case.     Aleck Stalling, PA-C, present and scrubbed throughout the case, critical for completion in a timely fashion, and for retraction, instrumentation, closure.

## 2023-07-24 NOTE — Anesthesia Procedure Notes (Signed)
 Anesthesia Regional Block: Interscalene brachial plexus block   Pre-Anesthetic Checklist: , timeout performed,  Correct Patient, Correct Site, Correct Laterality,  Correct Procedure, Correct Position, site marked,  Risks and benefits discussed,  Pre-op evaluation,  At surgeon's request and post-op pain management  Laterality: Right  Prep: Maximum Sterile Barrier Precautions used, chloraprep       Needles:  Injection technique: Single-shot  Needle Type: Echogenic Stimulator Needle     Needle Length: 5cm  Needle Gauge: 21     Additional Needles:   Procedures:,,,, ultrasound used (permanent image in chart),,    Narrative:  Start time: 07/24/2023 7:10 AM End time: 07/24/2023 7:13 AM Injection made incrementally with aspirations every 5 mL. Anesthesiologist: Niels Marien CROME, MD

## 2023-07-25 ENCOUNTER — Encounter (HOSPITAL_BASED_OUTPATIENT_CLINIC_OR_DEPARTMENT_OTHER): Payer: Self-pay | Admitting: Orthopaedic Surgery

## 2023-08-25 IMAGING — MR MR SHOULDER*L* W/O CM
6 series · 40 of 40 positions shown · non-contrast
Comparison: Left shoulder radiograph 08/31/2020

CLINICAL DATA: shoulder trauma, rotator cuff tear suspected, xray
done

EXAM:
MRI OF THE LEFT SHOULDER WITHOUT CONTRAST
TECHNIQUE: Multiplanar, multisequence MR imaging of the shoulder was performed.
No intravenous contrast was administered.

[Series 4: T2 fat-sat · axial · 4.0mm · 0.59mm/px · z∈[-50,+55]mm · 6 of 24 slices shown (1 of 4)]
[im 1/24]
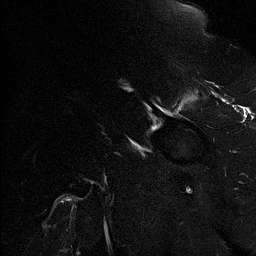
[im 5/24]
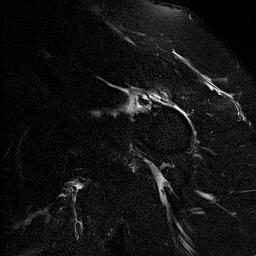
[im 10/24]
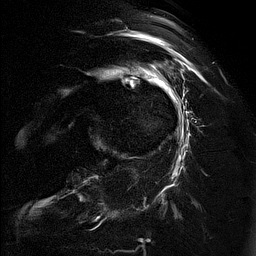
[im 14/24]
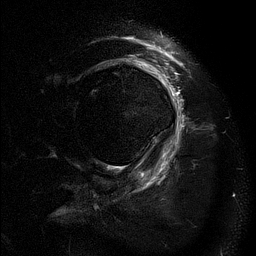
[im 19/24]
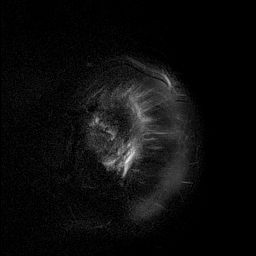
[im 24/24]
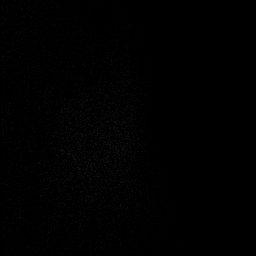

[Series 5: T2 fat-sat · oblique · 4.0mm · 0.59mm/px · 6 of 22 slices shown (2 of 4)]
[im 1/22]
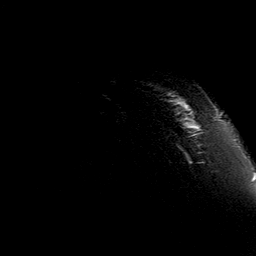
[im 5/22]
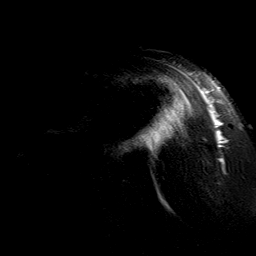
[im 9/22]
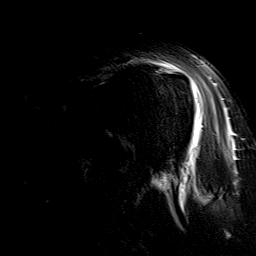
[im 13/22]
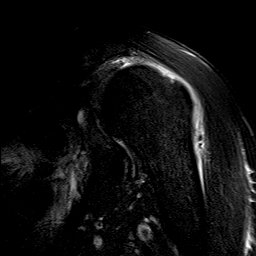
[im 17/22]
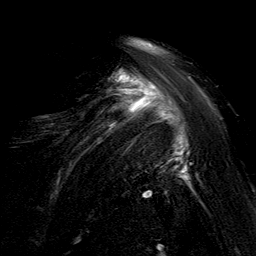
[im 22/22]
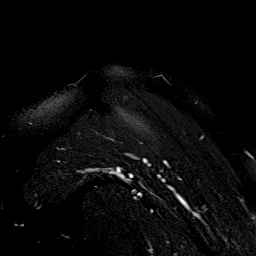

[Series 6: PD · oblique · 4.0mm · 0.59mm/px · 7 of 22 slices shown]
[im 1/22]
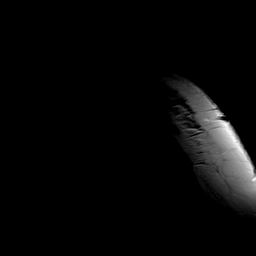
[im 4/22]
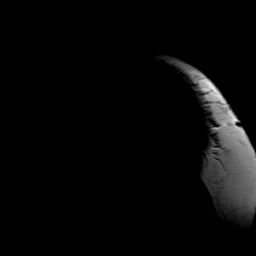
[im 8/22]
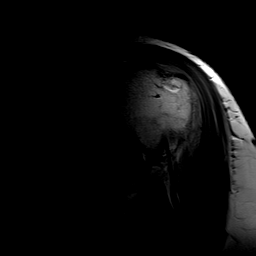
[im 11/22]
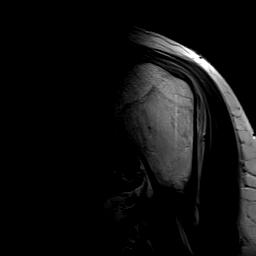
[im 15/22]
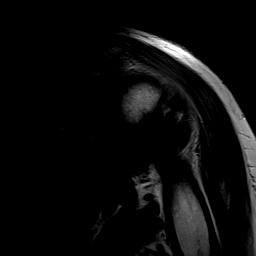
[im 18/22]
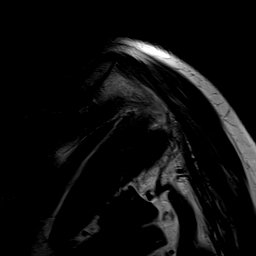
[im 22/22]
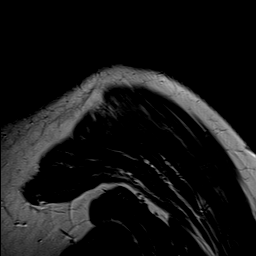

[Series 7: T1 · oblique · 4.0mm · 0.59mm/px · 7 of 22 slices shown]
[im 1/22]
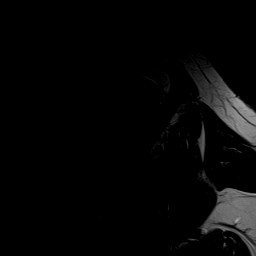
[im 4/22]
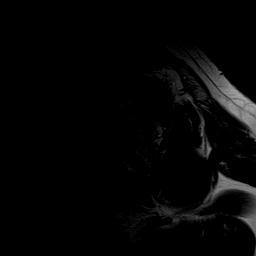
[im 8/22]
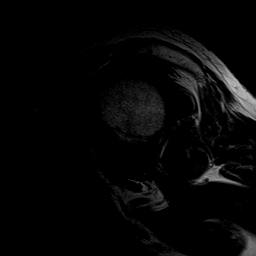
[im 11/22]
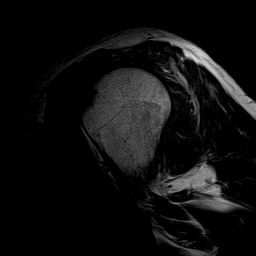
[im 15/22]
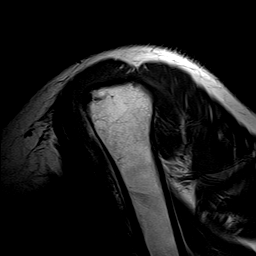
[im 18/22]
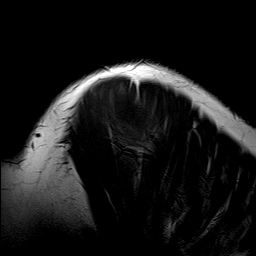
[im 22/22]
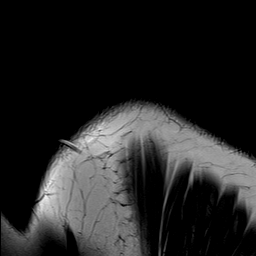

[Series 8: T2 fat-sat · oblique · 4.0mm · 0.59mm/px · 7 of 22 slices shown (3 of 4)]
[im 1/22]
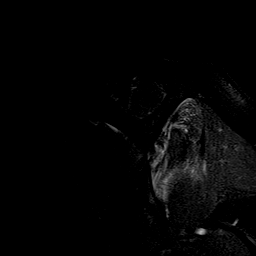
[im 4/22]
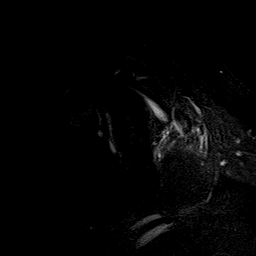
[im 8/22]
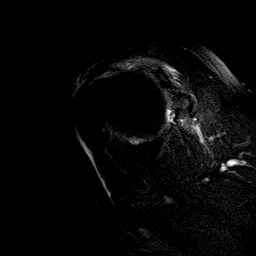
[im 11/22]
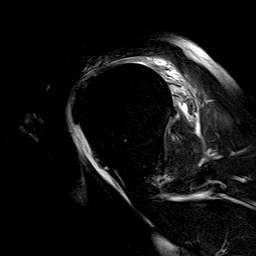
[im 15/22]
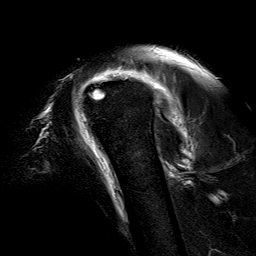
[im 18/22]
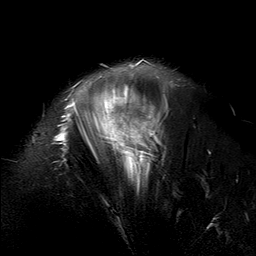
[im 22/22]
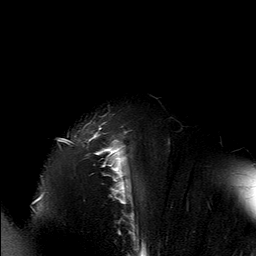

[Series 9: T2 fat-sat · oblique · 4.0mm · 0.29mm/px · 7 of 22 slices shown (4 of 4)]
[im 1/22]
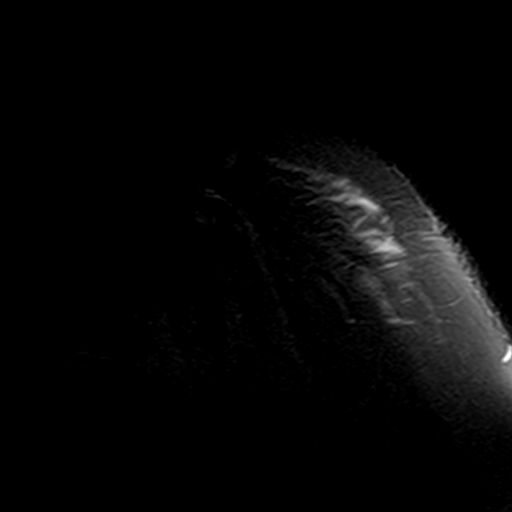
[im 4/22]
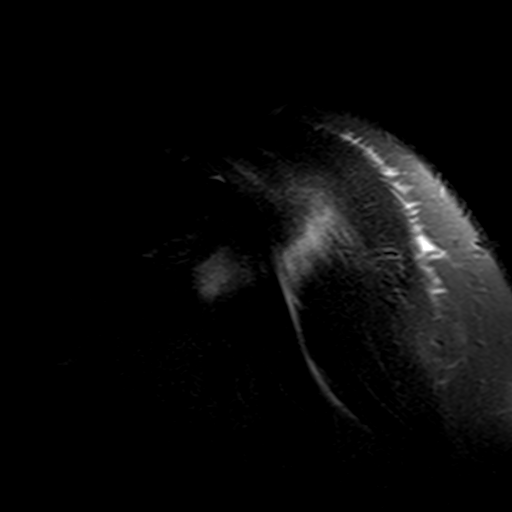
[im 8/22]
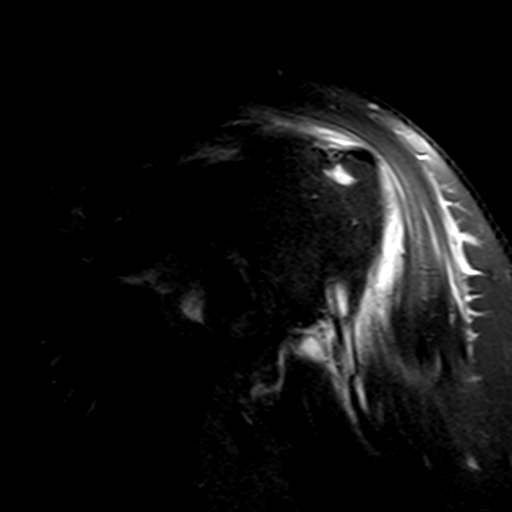
[im 11/22]
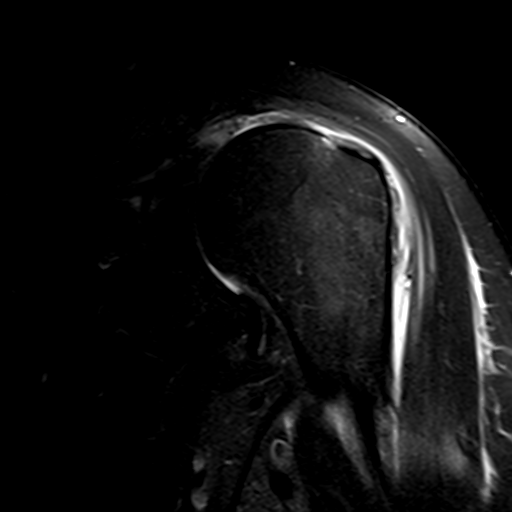
[im 15/22]
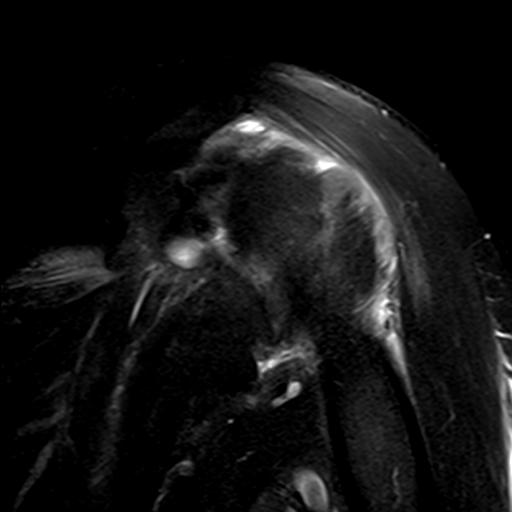
[im 18/22]
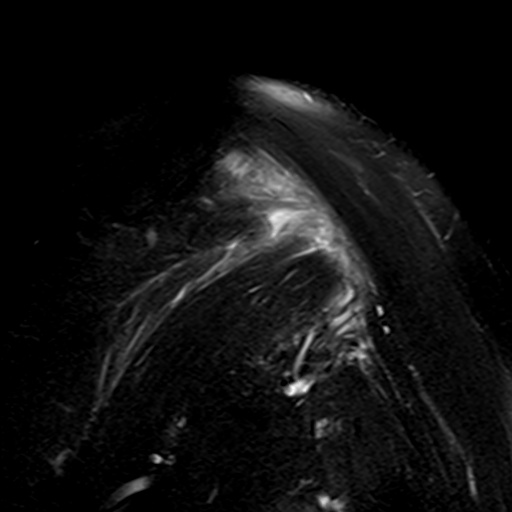
[im 22/22]
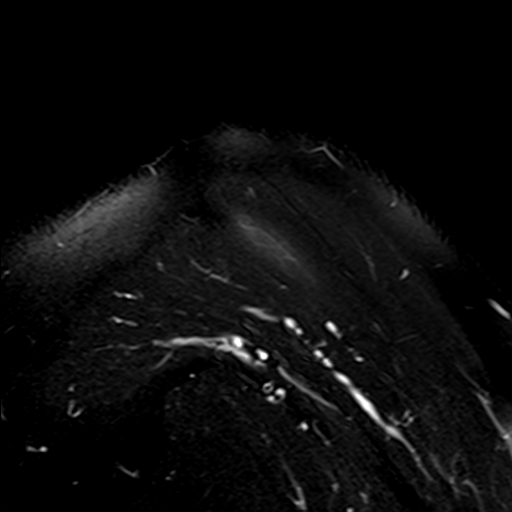

[40 of 40 positions shown; findings below may reference images not displayed]

FINDINGS: Rotator cuff: There is a full-thickness, full width tear of the
supraspinatus and infraspinatus tendons. The supraspinatus tendon is
retracted to the glenoid in the infraspinatus nearly to the glenoid.
Teres minor is intact. Subscapularis tendon is intact.

Muscles: Loss of muscle bulk of the supraspinatus.

Biceps Long Head: Intraarticular and extraarticular portions of the
biceps tendon are intact.

Acromioclavicular Joint: Moderate arthropathy of the
acromioclavicular joint. Small amount of subacromial/subdeltoid
bursal fluid related to the cuff tear.

Glenohumeral Joint: Small joint effusion. Mild to moderate
chondrosis.

Labrum: Posterosuperior and posteroinferior labral tearing.

Bones: No fracture or dislocation. No aggressive osseous lesion.

Other: Extensive soft tissue swelling along the shoulder.
IMPRESSION: Full-thickness, full width retracted tears of the supraspinatus and
infraspinatus tendons, as described above. Loss of muscle bulk of
the supraspinatus. No significant infraspinatus atrophy.

Posterosuperior and posteroinferior labral tearing. Mild to moderate
chondrosis.

Mild to moderate glenohumeral chondrosis. Moderate AC joint
arthropathy.

## 2023-09-26 ENCOUNTER — Other Ambulatory Visit: Payer: Self-pay | Admitting: Family Medicine

## 2023-09-26 MED ORDER — AMPHETAMINE-DEXTROAMPHETAMINE 15 MG PO TABS: 15.0000 mg | ORAL_TABLET | Freq: Two times a day (BID) | ORAL | 0 refills | Status: DC

## 2023-09-26 MED ORDER — AMPHETAMINE-DEXTROAMPHETAMINE 10 MG PO TABS: 10.0000 mg | ORAL_TABLET | Freq: Two times a day (BID) | ORAL | 0 refills | Status: DC

## 2023-09-26 NOTE — Telephone Encounter (Signed)
 Copied from CRM [id number]. Topic: Clinical - Medication Refill >> Sep 26, 2023 11:13 AM Harlene ORN wrote: Medication: amphetamine -dextroamphetamine  (ADDERALL) 10 MG tablet, amphetamine -dextroamphetamine  (ADDERALL) 15 MG tablet  Has the patient contacted their pharmacy? Yes (Agent: If no, request that the patient contact the pharmacy for the refill. If patient does not wish to contact the pharmacy document the reason why and proceed with request.) (Agent: If yes, when and what did the pharmacy advise?)  This is the patient's preferred pharmacy:  [facility] [id number] - [place], [place] - [address] [place] [address] Phone: [phone] Fax: [phone]  Is this the correct pharmacy for this prescription? Yes If no, delete pharmacy and type the correct one.   Has the prescription been filled recently? Yes  Is the patient out of the medication? Yes  Has the patient been seen for an appointment in the last year OR does the patient have an upcoming appointment? Yes  Can we respond through MyChart? Yes  Agent: Please be advised that Rx refills may take up to 3 business days. We ask that you follow-up with your pharmacy.

## 2023-11-19 ENCOUNTER — Other Ambulatory Visit: Payer: Self-pay | Admitting: Orthopaedic Surgery

## 2023-11-19 DIAGNOSIS — Z96611 Presence of right artificial shoulder joint: Secondary | ICD-10-CM

## 2023-11-21 ENCOUNTER — Ambulatory Visit
Admission: RE | Admit: 2023-11-21 | Discharge: 2023-11-21 | Disposition: A | Discharge status: Home / Self Care | Source: Ambulatory Visit | Attending: Orthopaedic Surgery | Admitting: Orthopaedic Surgery

## 2023-11-21 DIAGNOSIS — Z96611 Presence of right artificial shoulder joint: Secondary | ICD-10-CM

## 2023-11-25 ENCOUNTER — Other Ambulatory Visit: Payer: Self-pay | Admitting: Family Medicine

## 2023-11-25 NOTE — Telephone Encounter (Unsigned)
 Copied from CRM #[id number]. Topic: Clinical - Medication Refill >> Nov 25, 2023  8:22 AM Ahlexyia S wrote: Medication:  amphetamine -dextroamphetamine  (ADDERALL) 10 MG tablet amphetamine -dextroamphetamine  (ADDERALL) 15 MG tablet  Has the patient contacted their pharmacy? Yes, pt is always advised to contact clinic. (Agent: If no, request that the patient contact the pharmacy for the refill. If patient does not wish to contact the pharmacy document the reason why and proceed with request.) (Agent: If yes, when and what did the pharmacy advise?)  This is the patient's preferred pharmacy:  [facility] [id number] - [place], [place] - [address] [place] [address] Phone: [phone] Fax: [phone]  Is this the correct pharmacy for this prescription? Yes If no, delete pharmacy and type the correct one.   Has the prescription been filled recently? No  Is the patient out of the medication? No, limited supply for both.  Has the patient been seen for an appointment in the last year OR does the patient have an upcoming appointment? Yes  Can we respond through MyChart? No, prefers phone calls.  Agent: Please be advised that Rx refills may take up to 3 business days. We ask that you follow-up with your pharmacy.

## 2023-11-26 ENCOUNTER — Encounter: Payer: Self-pay | Admitting: Family Medicine

## 2023-11-26 ENCOUNTER — Ambulatory Visit: Admitting: Family Medicine

## 2023-11-26 VITALS — BP 110/78 | HR 95 | Temp 98.1°F | Ht 74.0 in | Wt 289.4 lb

## 2023-11-26 DIAGNOSIS — N400 Enlarged prostate without lower urinary tract symptoms: Secondary | ICD-10-CM

## 2023-11-26 DIAGNOSIS — F909 Attention-deficit hyperactivity disorder, unspecified type: Secondary | ICD-10-CM

## 2023-11-26 DIAGNOSIS — E1165 Type 2 diabetes mellitus with hyperglycemia: Secondary | ICD-10-CM | POA: Diagnosis not present

## 2023-11-26 DIAGNOSIS — R7989 Other specified abnormal findings of blood chemistry: Secondary | ICD-10-CM

## 2023-11-26 LAB — MICROALBUMIN / CREATININE URINE RATIO
Creatinine,U: 97.2 mg/dL
Microalb Creat Ratio: UNDETERMINED mg/g (ref 0.0–30.0)
Microalb, Ur: <0.7 mg/dL

## 2023-11-26 LAB — POCT GLYCOSYLATED HEMOGLOBIN (HGB A1C): Hemoglobin A1C: 8.4 % — AB (ref 4.0–5.6)

## 2023-11-26 MED ORDER — TAMSULOSIN HCL 0.4 MG PO CAPS: 0.8000 mg | ORAL_CAPSULE | Freq: Every day | ORAL | 3 refills | Status: AC

## 2023-11-26 MED ORDER — AMPHETAMINE-DEXTROAMPHETAMINE 10 MG PO TABS: 10.0000 mg | ORAL_TABLET | Freq: Two times a day (BID) | ORAL | 0 refills | Status: DC

## 2023-11-26 MED ORDER — AMPHETAMINE-DEXTROAMPHETAMINE 15 MG PO TABS: 15.0000 mg | ORAL_TABLET | Freq: Two times a day (BID) | ORAL | 0 refills | Status: DC

## 2023-11-26 NOTE — Assessment & Plan Note (Signed)
 Doing well current regimen Adderall immediate release 25 mg twice daily.  Will refill today.  No significant side effects.  Medications help with ability stay focused and on task at work.

## 2023-11-26 NOTE — Assessment & Plan Note (Signed)
 Follows with urology and is on testosterone  replacement.

## 2023-11-26 NOTE — Assessment & Plan Note (Signed)
 A1c 8.4.  He is not currently on any medications.  Previously discontinued metformin  due to side effects.  We did discuss trial of GLP agonist however he like to hold off on this for now.  He would like to work on diet and exercise.  Recheck in 3 months.  If A1c is not at goal would consider trial of Mounjaro at that time.

## 2023-11-26 NOTE — Assessment & Plan Note (Signed)
 Symptoms have worsened recently.  He has noticed doubling his Flomax  does seem to help.  Will increase this to 0.8 mg daily but did advise him to discuss this with urology as this may be a side effect of his testosterone  replacement.

## 2023-11-26 NOTE — Patient Instructions (Signed)
 It was very nice to see you today!  VISIT SUMMARY: Today, we discussed your ongoing management for diabetes, weight loss, urinary symptoms, and ADHD. We also reviewed your recent shoulder surgery recovery and medication adjustments.  YOUR PLAN: TYPE 2 DIABETES MELLITUS WITH HYPERGLYCEMIA: Your recent A1c level is 8.4, indicating that your blood sugar is not well controlled. -Focus on dietary changes to reduce sugar and carbohydrate intake. -We will reassess your A1c in three months.  MORBID OBESITY: You are motivated to lose weight through exercise and dietary changes. -Continue with regular physical activity and dietary modifications to support weight loss.  BENIGN PROSTATIC HYPERPLASIA: Your Flomax  is not as effective as before, and you may need a dose adjustment. -Continue taking Flomax , and you can double the dose if needed. -Discuss with a urologist about the dose adjustment and potential prostate enlargement. -Consider taking a daily low-dose Cialis  to improve urinary flow.  ATTENTION-DEFICIT HYPERACTIVITY DISORDER: Your current dose of Adderall is working well for you. -Continue taking Adderall 25 mg twice daily with a flexible dosing schedule. -Your Adderall prescription has been refilled.  Return in about 3 months (around 02/26/2024) for Follow Up.   Take care, Dr Kennyth  PLEASE NOTE:  If you had any lab tests, please let us  know if you have not heard back within a few days. You may see your results on mychart before we have a chance to review them but we will give you a call once they are reviewed by us .   If we ordered any referrals today, please let us  know if you have not heard from their office within the next week.   If you had any urgent prescriptions sent in today, please check with the pharmacy within an hour of our visit to make sure the prescription was transmitted appropriately.   Please try these tips to maintain a healthy lifestyle:  Eat at least 3 REAL  meals and 1-2 snacks per day.  Aim for no more than 5 hours between eating.  If you eat breakfast, please do so within one hour of getting up.   Each meal should contain half fruits/vegetables, one quarter protein, and one quarter carbs (no bigger than a computer mouse)  Cut down on sweet beverages. This includes juice, soda, and sweet tea.   Drink at least 1 glass of water with each meal and aim for at least 8 glasses per day  Exercise at least 150 minutes every week.

## 2023-11-26 NOTE — Telephone Encounter (Signed)
 LOV 07/01/2023 with an OV today  09/26/2023 fill date  60/0 refills

## 2023-11-26 NOTE — Assessment & Plan Note (Signed)
 BMI 37.16 today with comorbidities.  He is working on last interventions as above though may consider addition of GLP agonist at some point in the future if his A1c is not controlled.

## 2023-11-26 NOTE — Progress Notes (Signed)
   Clinton West is a 58 y.o. male who presents today for an office visit.  Assessment/Plan:  Chronic Problems Addressed Today: No problem-specific Assessment & Plan notes found for this encounter.     Subjective:  HPI:  See assessment / plan for status of chronic conditions.   Discussed the use of AI scribe software for clinical note transcription with the patient, who gave verbal consent to proceed.  History of Present Illness Clinton West is a 58 year old male who presents for follow-up and medication management.  He underwent shoulder replacement surgery in July and has been on light duty for about a month and a half. He continues with physical therapy and reports that his range of motion is about as good as it is going to get. About a month ago, he experienced a muscle strain in his shoulder after sitting in a broken chair, causing ongoing pain.  He is currently taking tadalafil  for testosterone  management and Flomax  for urinary symptoms. Flomax  is not as effective as before, and he has accidentally taken two doses, which seemed to improve symptoms. His testosterone  level was recently checked and reported as 800.  He has a history of diabetes with a recent A1c of 8.4. He attributes this to decreased physical activity following his surgery and recent dietary choices, including Halloween candy. He is not currently taking metformin  due to concerns about its side effects and is focusing on weight loss and dietary changes to manage his blood sugar.  He reports a recent sinus infection that resolved on its own, with symptoms including smelling 'burnt wire and dirt.'  He is taking Adderall 25 mg twice daily for attention issues and reports it is working well. He sometimes adjusts the dose based on his schedule to avoid sleep disturbances.         Objective:  Physical Exam: BP 110/78   Pulse 95   Temp 98.1 F (36.7 C) (Temporal)   Ht 6' 2 (1.88 m)   Wt 289 lb  6.4 oz (131.3 kg)   SpO2 97%   BMI 37.16 kg/m   Wt Readings from Last 3 Encounters:  11/26/23 289 lb 6.4 oz (131.3 kg)  07/24/23 281 lb 15.5 oz (127.9 kg)  07/01/23 283 lb 12.8 oz (128.7 kg)    Gen: No acute distress, resting comfortably CV: Regular rate and rhythm with no murmurs appreciated Pulm: Normal work of breathing, clear to auscultation bilaterally with no crackles, wheezes, or rhonchi Neuro: Grossly normal, moves all extremities Psych: Normal affect and thought content      Levante Simones M. Kennyth, MD 11/26/2023 8:12 AM

## 2023-11-27 ENCOUNTER — Ambulatory Visit: Payer: Self-pay | Admitting: Family Medicine

## 2023-11-27 ENCOUNTER — Ambulatory Visit: Admitting: Family Medicine

## 2023-11-27 NOTE — Progress Notes (Signed)
 Great news! Urine sample is normal. We can recheck in a year.

## 2024-02-11 ENCOUNTER — Other Ambulatory Visit: Payer: Self-pay | Admitting: *Deleted

## 2024-02-11 NOTE — Telephone Encounter (Signed)
Patient requesting Rx Adderall refill  

## 2024-02-12 MED ORDER — AMPHETAMINE-DEXTROAMPHETAMINE 10 MG PO TABS: 10.0000 mg | ORAL_TABLET | Freq: Two times a day (BID) | ORAL | 0 refills | Status: AC

## 2024-02-12 MED ORDER — AMPHETAMINE-DEXTROAMPHETAMINE 15 MG PO TABS: 15.0000 mg | ORAL_TABLET | Freq: Two times a day (BID) | ORAL | 0 refills | Status: AC

## 2024-02-26 ENCOUNTER — Ambulatory Visit: Admitting: Family Medicine
